# Patient Record
Sex: Female | Born: 1950 | Race: Black or African American | Hispanic: No | Smoking: Current every day smoker
Health system: Southern US, Community
[De-identification: ages and names within clinical notes are randomized; demographics above are authoritative.]

## PROBLEM LIST (undated history)

## (undated) DIAGNOSIS — R7303 Prediabetes: Secondary | ICD-10-CM

## (undated) DIAGNOSIS — M810 Age-related osteoporosis without current pathological fracture: Secondary | ICD-10-CM

## (undated) DIAGNOSIS — F17200 Nicotine dependence, unspecified, uncomplicated: Secondary | ICD-10-CM

## (undated) DIAGNOSIS — H269 Unspecified cataract: Secondary | ICD-10-CM

## (undated) DIAGNOSIS — G5600 Carpal tunnel syndrome, unspecified upper limb: Secondary | ICD-10-CM

## (undated) DIAGNOSIS — M5412 Radiculopathy, cervical region: Secondary | ICD-10-CM

## (undated) DIAGNOSIS — G510 Bell's palsy: Secondary | ICD-10-CM

## (undated) HISTORY — DX: Prediabetes: R73.03

## (undated) HISTORY — DX: Carpal tunnel syndrome, unspecified upper limb: G56.00

## (undated) HISTORY — DX: Nicotine dependence, unspecified, uncomplicated: F17.200

## (undated) HISTORY — DX: Bell's palsy: G51.0

## (undated) HISTORY — DX: Unspecified cataract: H26.9

## (undated) HISTORY — PX: CATARACT EXTRACTION: SUR2

## (undated) HISTORY — PX: ABDOMINAL HYSTERECTOMY: SHX81

## (undated) HISTORY — DX: Age-related osteoporosis without current pathological fracture: M81.0

## (undated) HISTORY — DX: Radiculopathy, cervical region: M54.12

---

## 1993-03-16 HISTORY — PX: TOTAL ABDOMINAL HYSTERECTOMY W/ BILATERAL SALPINGOOPHORECTOMY: SHX83

## 1997-10-16 ENCOUNTER — Emergency Department (HOSPITAL_COMMUNITY): Admission: EM | Admit: 1997-10-16 | Discharge: 1997-10-16 | Payer: Self-pay | Admitting: Emergency Medicine

## 1998-08-09 ENCOUNTER — Emergency Department (HOSPITAL_COMMUNITY): Admission: EM | Admit: 1998-08-09 | Discharge: 1998-08-10 | Payer: Self-pay

## 1998-08-22 ENCOUNTER — Emergency Department (HOSPITAL_COMMUNITY): Admission: EM | Admit: 1998-08-22 | Discharge: 1998-08-22 | Payer: Self-pay | Admitting: Emergency Medicine

## 1999-05-03 ENCOUNTER — Emergency Department (HOSPITAL_COMMUNITY): Admission: EM | Admit: 1999-05-03 | Discharge: 1999-05-03 | Payer: Self-pay

## 2000-04-14 ENCOUNTER — Encounter (INDEPENDENT_AMBULATORY_CARE_PROVIDER_SITE_OTHER): Payer: Self-pay | Admitting: Specialist

## 2000-04-14 ENCOUNTER — Ambulatory Visit (HOSPITAL_COMMUNITY): Admission: RE | Admit: 2000-04-14 | Discharge: 2000-04-14 | Payer: Self-pay | Admitting: Urology

## 2000-10-24 ENCOUNTER — Emergency Department (HOSPITAL_COMMUNITY): Admission: EM | Admit: 2000-10-24 | Discharge: 2000-10-24 | Payer: Self-pay | Admitting: *Deleted

## 2000-12-26 ENCOUNTER — Emergency Department (HOSPITAL_COMMUNITY): Admission: EM | Admit: 2000-12-26 | Discharge: 2000-12-26 | Payer: Self-pay | Admitting: Emergency Medicine

## 2001-03-24 ENCOUNTER — Other Ambulatory Visit: Admission: RE | Admit: 2001-03-24 | Discharge: 2001-03-24 | Payer: Self-pay | Admitting: Obstetrics and Gynecology

## 2001-09-05 ENCOUNTER — Emergency Department (HOSPITAL_COMMUNITY): Admission: EM | Admit: 2001-09-05 | Discharge: 2001-09-06 | Payer: Self-pay | Admitting: Emergency Medicine

## 2001-12-22 ENCOUNTER — Encounter: Payer: Self-pay | Admitting: Emergency Medicine

## 2001-12-22 ENCOUNTER — Emergency Department (HOSPITAL_COMMUNITY): Admission: EM | Admit: 2001-12-22 | Discharge: 2001-12-22 | Payer: Self-pay | Admitting: Emergency Medicine

## 2002-05-15 ENCOUNTER — Other Ambulatory Visit: Admission: RE | Admit: 2002-05-15 | Discharge: 2002-05-15 | Payer: Self-pay | Admitting: Obstetrics and Gynecology

## 2003-09-02 ENCOUNTER — Emergency Department (HOSPITAL_COMMUNITY): Admission: EM | Admit: 2003-09-02 | Discharge: 2003-09-03 | Payer: Self-pay | Admitting: Emergency Medicine

## 2003-12-05 ENCOUNTER — Other Ambulatory Visit: Admission: RE | Admit: 2003-12-05 | Discharge: 2003-12-05 | Payer: Self-pay | Admitting: Obstetrics and Gynecology

## 2004-02-05 ENCOUNTER — Ambulatory Visit: Payer: Self-pay | Admitting: Cardiology

## 2004-03-05 ENCOUNTER — Ambulatory Visit: Payer: Self-pay

## 2005-04-02 ENCOUNTER — Other Ambulatory Visit: Admission: RE | Admit: 2005-04-02 | Discharge: 2005-04-02 | Payer: Self-pay | Admitting: Obstetrics and Gynecology

## 2005-09-22 ENCOUNTER — Emergency Department (HOSPITAL_COMMUNITY): Admission: EM | Admit: 2005-09-22 | Discharge: 2005-09-22 | Payer: Self-pay | Admitting: Family Medicine

## 2005-10-11 ENCOUNTER — Emergency Department (HOSPITAL_COMMUNITY): Admission: EM | Admit: 2005-10-11 | Discharge: 2005-10-11 | Payer: Self-pay | Admitting: Emergency Medicine

## 2005-12-03 ENCOUNTER — Emergency Department (HOSPITAL_COMMUNITY): Admission: EM | Admit: 2005-12-03 | Discharge: 2005-12-03 | Payer: Self-pay | Admitting: Family Medicine

## 2006-06-24 ENCOUNTER — Ambulatory Visit (HOSPITAL_COMMUNITY): Admission: RE | Admit: 2006-06-24 | Discharge: 2006-06-24 | Payer: Self-pay | Admitting: *Deleted

## 2006-06-24 ENCOUNTER — Encounter (INDEPENDENT_AMBULATORY_CARE_PROVIDER_SITE_OTHER): Payer: Self-pay | Admitting: Specialist

## 2006-10-04 ENCOUNTER — Emergency Department (HOSPITAL_COMMUNITY): Admission: EM | Admit: 2006-10-04 | Discharge: 2006-10-04 | Payer: Self-pay | Admitting: Family Medicine

## 2007-03-17 HISTORY — PX: COLONOSCOPY: SHX174

## 2008-10-06 ENCOUNTER — Emergency Department (HOSPITAL_COMMUNITY): Admission: EM | Admit: 2008-10-06 | Discharge: 2008-10-06 | Payer: Self-pay | Admitting: Family Medicine

## 2008-10-08 ENCOUNTER — Emergency Department (HOSPITAL_COMMUNITY): Admission: EM | Admit: 2008-10-08 | Discharge: 2008-10-08 | Payer: Self-pay | Admitting: Emergency Medicine

## 2009-01-18 ENCOUNTER — Ambulatory Visit (HOSPITAL_COMMUNITY): Admission: RE | Admit: 2009-01-18 | Discharge: 2009-01-18 | Payer: Self-pay | Admitting: Obstetrics and Gynecology

## 2010-02-04 ENCOUNTER — Ambulatory Visit (HOSPITAL_COMMUNITY)
Admission: RE | Admit: 2010-02-04 | Discharge: 2010-02-04 | Payer: Self-pay | Source: Home / Self Care | Admitting: Obstetrics and Gynecology

## 2010-08-01 NOTE — Op Note (Signed)
NAMECAROLYNNE, SCHUCHARD NO.:  [id number]   MEDICAL RECORD NO.:  [id number]          PATIENT TYPE:  AMB   LOCATION:  ENDO                         FACILITY:  [facility]   PHYSICIAN:  Georgiana Spinner, M.D.    DATE OF BIRTH:  November 20, 1950   DATE OF PROCEDURE:  06/24/2006  DATE OF DISCHARGE:                               OPERATIVE REPORT   SURGEON:  Georgiana Spinner, M.D.   PROCEDURE:  Colonoscopy.   INDICATIONS:  Colon cancer screening.   ANESTHESIA:  Fentanyl 100 mcg, Versed 10 mg.   DESCRIPTION OF PROCEDURE:  With the patient mildly sedated in the left  lateral decubitus position, the Pentax videoscopic colonoscope was  inserted into the rectum and passed under direct vision to the cecum,  identified by the ileocecal valve and appendiceal orifice, both of which  were photographed.  From this point, the endoscope was slowly withdrawn,  taking circumferential views of colonic mucosa, stopping in the rectum,  where 2 small polyps were seen, photographed and removed using hot  biopsy forceps technique at a setting of 20/200 blended current.  The  endoscope was then straightened and withdrawn fro the retroflex view.  The patient's vital signs and pulse oximetry remained stable.  The  patient tolerated the procedure well and without apparent complications.   FINDINGS:  Two small polyps in the rectum on retroflex view, otherwise,  an unremarkable examination.   PLAN:  Await biopsy report.  Patient will call me for results and follow  up with me as needed as an outpatient.           ______________________________  Georgiana Spinner, M.D.     GMO/MEDQ  D:  06/24/2006  T:  06/24/2006  Job:  [id number]

## 2010-08-01 NOTE — Op Note (Signed)
[facility]  Patient:    NIEVE, ROJERO                       MRN: [id number] Proc. Date: 04/14/00 Adm. Date:  [id number] Attending:  Katherine Roan                           Operative Report  PREOPERATIVE DIAGNOSIS:  Papilloma, left anterior bladder wall; inflammation, right posterior base; hematuria in a smoker.  POSTOPERATIVE DIAGNOSIS:  Papilloma, left anterior bladder wall, inflammation, right posterior base, hematuria in a smoker.  OPERATION PERFORMED:  Transurethral resection of bladder tumor, left anterior wall; bladder biopsy x 2, right posterior base.  SURGEON:  Rozanna Boer., M.D.  ANESTHESIA:  General.  INDICATIONS FOR PROCEDURE:  This 60 year old black female smoker is admitted with signs of small papillary tumor in the left anterior wall and some inflammation of the right posterior base for biopsies.  She was being evaluated for hematuria and the above findings were found.  IVP showed normal upper tracts.  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  She had not had previous hematuria, does smoke one half to one pack a day.  DESCRIPTION OF PROCEDURE:  The patient was placed on the operating table in the dorsal lithotomy position after satisfactory induction of general anesthesia, was prepped and draped with Betadine in the usual sterile fashion. Her urethra was a little tight, but a #21 panendoscope was inserted and the bladder inspected.  Pictures were made of the lesion of the posterior base on the left and the left anterior wall papilloma and these lesions were then biopsied.  The papilloma came off with one biopsy and was completely excised and then two biopsies were made of the inflammation of the right posterior base.  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  The rest of the bladder looked good except for mild squamous metaplasia.  The lesions were then cauterized with a  Bugbee to effect good hemostasis.  The bladder was drained, scope removed.  Patient taken to recovery room in good condition to be later discharged as an outpatient without a catheter if she is able to void and if these are benign as expected, we will just recystoscope in three months. DD:  04/14/00 TD:  04/14/00 Job: [id number]

## 2010-10-03 ENCOUNTER — Inpatient Hospital Stay (INDEPENDENT_AMBULATORY_CARE_PROVIDER_SITE_OTHER)
Admission: RE | Admit: 2010-10-03 | Discharge: 2010-10-03 | Disposition: A | Discharge status: Home / Self Care | Payer: Self-pay | Source: Ambulatory Visit | Attending: Emergency Medicine | Admitting: Emergency Medicine

## 2010-10-03 DIAGNOSIS — K056 Periodontal disease, unspecified: Secondary | ICD-10-CM

## 2010-10-03 DIAGNOSIS — K047 Periapical abscess without sinus: Secondary | ICD-10-CM

## 2010-10-20 ENCOUNTER — Inpatient Hospital Stay (INDEPENDENT_AMBULATORY_CARE_PROVIDER_SITE_OTHER)
Admission: RE | Admit: 2010-10-20 | Discharge: 2010-10-20 | Disposition: A | Discharge status: Home / Self Care | Payer: Self-pay | Source: Ambulatory Visit | Attending: Emergency Medicine | Admitting: Emergency Medicine

## 2010-10-20 DIAGNOSIS — M779 Enthesopathy, unspecified: Secondary | ICD-10-CM

## 2011-01-27 ENCOUNTER — Other Ambulatory Visit (HOSPITAL_COMMUNITY): Payer: Self-pay | Admitting: Obstetrics and Gynecology

## 2011-01-27 DIAGNOSIS — Z1231 Encounter for screening mammogram for malignant neoplasm of breast: Secondary | ICD-10-CM

## 2011-02-26 ENCOUNTER — Ambulatory Visit (HOSPITAL_COMMUNITY)
Admission: RE | Admit: 2011-02-26 | Discharge: 2011-02-26 | Disposition: A | Discharge status: Home / Self Care | Payer: PRIVATE HEALTH INSURANCE | Source: Ambulatory Visit | Attending: Obstetrics and Gynecology | Admitting: Obstetrics and Gynecology

## 2011-02-26 DIAGNOSIS — Z1231 Encounter for screening mammogram for malignant neoplasm of breast: Secondary | ICD-10-CM

## 2011-02-27 ENCOUNTER — Other Ambulatory Visit: Payer: Self-pay | Admitting: Obstetrics and Gynecology

## 2011-02-27 DIAGNOSIS — R928 Other abnormal and inconclusive findings on diagnostic imaging of breast: Secondary | ICD-10-CM

## 2011-03-02 ENCOUNTER — Emergency Department (HOSPITAL_COMMUNITY): Payer: Self-pay

## 2011-03-02 ENCOUNTER — Other Ambulatory Visit: Payer: Self-pay

## 2011-03-02 ENCOUNTER — Emergency Department (HOSPITAL_COMMUNITY)
Admission: EM | Admit: 2011-03-02 | Discharge: 2011-03-02 | Disposition: A | Discharge status: Home / Self Care | Payer: Self-pay | Attending: Emergency Medicine | Admitting: Emergency Medicine

## 2011-03-02 ENCOUNTER — Encounter (HOSPITAL_COMMUNITY): Payer: Self-pay

## 2011-03-02 ENCOUNTER — Emergency Department (INDEPENDENT_AMBULATORY_CARE_PROVIDER_SITE_OTHER)
Admission: EM | Admit: 2011-03-02 | Discharge: 2011-03-02 | Disposition: A | Discharge status: Nursing Home (Includes ICF) | Payer: Self-pay | Source: Home / Self Care | Attending: Emergency Medicine | Admitting: Emergency Medicine

## 2011-03-02 ENCOUNTER — Encounter: Payer: Self-pay | Admitting: *Deleted

## 2011-03-02 ENCOUNTER — Encounter (HOSPITAL_COMMUNITY): Payer: Self-pay | Admitting: Emergency Medicine

## 2011-03-02 DIAGNOSIS — R079 Chest pain, unspecified: Secondary | ICD-10-CM | POA: Insufficient documentation

## 2011-03-02 DIAGNOSIS — R202 Paresthesia of skin: Secondary | ICD-10-CM

## 2011-03-02 DIAGNOSIS — H5509 Other forms of nystagmus: Secondary | ICD-10-CM | POA: Insufficient documentation

## 2011-03-02 DIAGNOSIS — Z87891 Personal history of nicotine dependence: Secondary | ICD-10-CM | POA: Insufficient documentation

## 2011-03-02 DIAGNOSIS — R42 Dizziness and giddiness: Secondary | ICD-10-CM | POA: Insufficient documentation

## 2011-03-02 DIAGNOSIS — R209 Unspecified disturbances of skin sensation: Secondary | ICD-10-CM

## 2011-03-02 LAB — CARDIAC PANEL(CRET KIN+CKTOT+MB+TROPI)
CK, MB: 3.9 ng/mL (ref 0.3–4.0)
Relative Index: 0.9 (ref 0.0–2.5)
Relative Index: 1 (ref 0.0–2.5)
Total CK: 406 U/L — ABNORMAL HIGH (ref 7–177)
Troponin I: <0.3 ng/mL (ref <0.30)
Troponin I: <0.3 ng/mL (ref <0.30)

## 2011-03-02 LAB — DIFFERENTIAL
Basophils Relative: 0 % (ref 0–1)
Lymphocytes Relative: 30 % (ref 12–46)
Lymphs Abs: 2.7 10*3/uL (ref 0.7–4.0)
Monocytes Absolute: 0.6 10*3/uL (ref 0.1–1.0)
Monocytes Relative: 7 % (ref 3–12)
Neutro Abs: 5.6 10*3/uL (ref 1.7–7.7)
Neutrophils Relative %: 61 % (ref 43–77)

## 2011-03-02 LAB — CBC
HCT: 42.5 % (ref 36.0–46.0)
Hemoglobin: 14.5 g/dL (ref 12.0–15.0)
MCHC: 34.1 g/dL (ref 30.0–36.0)
RBC: 4.96 MIL/uL (ref 3.87–5.11)

## 2011-03-02 LAB — BASIC METABOLIC PANEL
BUN: 13 mg/dL (ref 6–23)
CO2: 22 mEq/L (ref 19–32)
Chloride: 103 mEq/L (ref 96–112)
Creatinine, Ser: 0.74 mg/dL (ref 0.50–1.10)
GFR calc Af Amer: >90 mL/min (ref >90)
Glucose, Bld: 90 mg/dL (ref 70–99)
Potassium: 4 mEq/L (ref 3.5–5.1)

## 2011-03-02 MED ORDER — MECLIZINE HCL 25 MG PO TABS: 25.0000 mg | ORAL_TABLET | Freq: Three times a day (TID) | ORAL | Status: AC | PRN

## 2011-03-02 MED ORDER — MECLIZINE HCL 25 MG PO TABS
25.0000 mg | ORAL_TABLET | Freq: Three times a day (TID) | ORAL | Status: DC | PRN
Start: 2011-03-02 — End: 2011-03-02

## 2011-03-02 NOTE — ED Notes (Signed)
PT HAS RETURNED FROM MRI 

## 2011-03-02 NOTE — ED Notes (Signed)
Pt. Transferred from Urgent care Chest pain free for further care.  Pt. Reports having  Intermittent dizziness and sob, ECG was Unremarkable reported by urgent care nurse

## 2011-03-02 NOTE — ED Notes (Signed)
PT UNFRIENDLY TO STAFF. STATES WE ARE NOT DOING A GOOD JOB BECAUSE SHE HAS BEEN HERE SINCE 11 AM AND HAS NOT GOTTEN HER STUDIES COMPLETED AND SHE IS HUNGRY AND THIRSTY. PT DOES NOT ALLOW STAFF TO FINISH A SENTENCE BEFORE JUMPING IN WITH NEGATIVE COMMENTS.

## 2011-03-02 NOTE — ED Provider Notes (Signed)
History     CSN: [id number] Arrival date & time: 03/02/2011  9:12 AM   First MD Initiated Contact with Patient 03/02/11 [phone]      Chief Complaint  Patient presents with  . Dizziness  . Numbness  . Chest Pain    (Consider location/radiation/quality/duration/timing/severity/associated sxs/prior treatment) HPI Comments: For about 3 days , been feeling dizzy, like spells they come and go, have felt some chest pains" (points to substernal region), with shortness of breath, and my R arm goes numb" last chest pain was last night", Im just lightheaded now",  Patient is a 60 y.o. female presenting with chest pain. The history is provided by the patient.  Chest Pain The chest pain began 3 - 5 days ago. Chest pain occurs intermittently. The chest pain is worsening. The quality of the pain is described as brief and aching. The pain does not radiate. Primary symptoms include shortness of breath and dizziness. Pertinent negatives for primary symptoms include no fatigue, no syncope, no palpitations, no abdominal pain, no nausea, no vomiting and no altered mental status.  Dizziness does not occur with nausea, vomiting, weakness or diaphoresis.  Associated symptoms include numbness.  Pertinent negatives for associated symptoms include no diaphoresis, no lower extremity edema, no near-syncope, no orthopnea, no paroxysmal nocturnal dyspnea and no weakness. She tried nothing for the symptoms.     History reviewed. No pertinent past medical history.  Past Surgical History  Procedure Date  . Abdominal hysterectomy     Family History  Problem Relation Age of Onset  . Diabetes Mother     History  Substance Use Topics  . Smoking status: Unknown If Ever Smoked  . Smokeless tobacco: Not on file  . Alcohol Use: Yes     Social Use    OB History    Grav Para Term Preterm Abortions TAB SAB Ect Mult Living                  Review of Systems  Constitutional: Negative for diaphoresis and  fatigue.  Respiratory: Positive for shortness of breath.   Cardiovascular: Positive for chest pain. Negative for palpitations, orthopnea, syncope and near-syncope.  Gastrointestinal: Negative for nausea, vomiting and abdominal pain.  Neurological: Positive for dizziness and numbness. Negative for weakness.  Psychiatric/Behavioral: Negative for altered mental status.    Allergies  Review of patient's allergies indicates no known allergies.  Home Medications   Current Outpatient Rx  Name Route Sig Dispense Refill  . NAPROXEN SODIUM 220 MG PO TABS Oral Take 220 mg by mouth 2 (two) times daily as needed. For pain       BP 132/85  Pulse 70  Temp(Src) 98 F (36.7 C) (Oral)  Resp 18  SpO2 97%  Physical Exam  Nursing note and vitals reviewed. Constitutional: She is oriented to person, place, and time. She appears well-developed and well-nourished. No distress.  Eyes: Pupils are equal, round, and reactive to light.  Neck: Normal range of motion. No JVD present.  Cardiovascular: Normal rate, regular rhythm and normal heart sounds.   Pulmonary/Chest: Effort normal and breath sounds normal. No respiratory distress. She has no decreased breath sounds.  Neurological: She is alert and oriented to person, place, and time. She has normal strength. No cranial nerve deficit or sensory deficit. She exhibits normal muscle tone. Coordination normal.  Skin: Skin is warm. She is not diaphoretic.    ED Course  Procedures (including critical care time)  Labs Reviewed - No data to display  No results found.   1. Dizziness   2. Chest pain   3. Paresthesias       MDM  Multi-symptomatic-comfortable and stable, including recurrent CP and paresthesias.      Jimmie Molly, MD 03/02/11 [phone]

## 2011-03-02 NOTE — ED Notes (Signed)
Pt c/o "dizzy spells" x approx 3 days. Pt states, "When they come on, my right arm goes numb, my ringers tingle, and I get some pain in my chest too" - severity increases w/ fast, quick movement. Pt denies s/s at this time; denies visual disturbances when these "spells" occur.

## 2011-03-02 NOTE — ED Notes (Signed)
MEAL ORDERED FOR PT 

## 2011-03-02 NOTE — ED Provider Notes (Signed)
History     CSN: [id number] Arrival date & time: 03/02/2011 10:37 AM   First MD Initiated Contact with Patient 03/02/11 1201      Chief Complaint  Patient presents with  . Dizziness    HPI  60yoF is a healthy presents with multiple complaints. The patient is complaining of lightheadedness as her chief complaint. She describes some vertigo component to it. It is intermittent and worse when she is walking around when she moves her head to the left. No ear pain/fullness/tinnitus. Has been present for 3 days. She denies pain in her ears. She denies fever, chills. She states that for the past one week she's experienced right arm heaviness/numbness and paresthesias to all fingers. This has been constant. She denies any pain in her neck or back. Over the past one week she's also experienced intermittent chest "pinching" retrosternal without radiation. She's also had 2 episodes of shortness of breath with exertion. She denies headache. She denies history of similar. She states that approximately 9 months ago she had a normal stress test. She is a former smoker otherwise no risk factors for coronary artery disease. Denies h/o VTE in self or family. No recent hosp/surg/immob. No h/o cancer. Denies exogenous hormone use, no leg pain or swelling  The patient was seen this morning by urgent care referred here. See their note for full details.  History reviewed. No pertinent past medical history.  Past Surgical History  Procedure Date  . Abdominal hysterectomy     Family History  Problem Relation Age of Onset  . Diabetes Mother     History  Substance Use Topics  . Smoking status: Unknown If Ever Smoked  . Smokeless tobacco: Not on file  . Alcohol Use: Yes     Social Use    OB History    Grav Para Term Preterm Abortions TAB SAB Ect Mult Living                  Review of Systems  All other systems reviewed and are negative.   except as noted HPI   Allergies  Review of patient's  allergies indicates no known allergies.  Home Medications   Current Outpatient Rx  Name Route Sig Dispense Refill  . NAPROXEN SODIUM 220 MG PO TABS Oral Take 220 mg by mouth 2 (two) times daily as needed. For pain       BP 155/77  Pulse 64  Temp(Src) 98.8 F (37.1 C) (Oral)  Resp 18  Ht 4\' 11"  (1.499 m)  Wt 160 lb (72.576 kg)  BMI 32.32 kg/m2  SpO2 97%  Physical Exam  Nursing note and vitals reviewed. Constitutional: She is oriented to person, place, and time. She appears well-developed.  HENT:  Head: Atraumatic.  Mouth/Throat: Oropharynx is clear and moist.       TM clear b/l  Eyes: Conjunctivae and EOM are normal. Pupils are equal, round, and reactive to light.       Horizontal nystagmus with EOM  Neck: Normal range of motion. Neck supple.  Cardiovascular: Normal rate, regular rhythm, normal heart sounds and intact distal pulses.   Pulmonary/Chest: Effort normal and breath sounds normal. No respiratory distress. She has no wheezes. She has no rales.  Abdominal: Soft. She exhibits no distension. There is no tenderness. There is no rebound and no guarding.  Musculoskeletal: Normal range of motion.  Neurological: She is alert and oriented to person, place, and time. No cranial nerve deficit. She exhibits normal muscle tone. Coordination  normal.       Strength 5/5 all extremities No pronator drift No facial droop  Dix Hallpike neg  Skin: Skin is warm and dry. No rash noted.  Psychiatric: She has a normal mood and affect.    Date: 03/02/2011  Rate: 60  Rhythm: normal sinus rhythm  QRS Axis: normal  Intervals: normal  ST/T Wave abnormalities: nonspecific T wave changes  Conduction Disutrbances:none  Narrative Interpretation:   Old EKG Reviewed: changes noted new t wave inversions aVF   Date: 03/02/2011  Rate: 64  Rhythm: normal sinus rhythm  QRS Axis: normal  Intervals: normal  ST/T Wave abnormalities: nonspecific T wave changes  Conduction Disutrbances:none   Narrative Interpretation:   Old EKG Reviewed: unchanged   ED Course  Procedures (including critical care time)   Labs Reviewed  CBC  DIFFERENTIAL  BASIC METABOLIC PANEL  CARDIAC PANEL(CRET KIN+CKTOT+MB+TROPI)   No results found.   No diagnosis found.    MDM  Presents with multiple complaints. Her chief complaint here is dizziness her neurological exam is intact although she does have some nystagmus in the horizontal direction. Her Gilberto Better is negative and I cannot reproduce her dizziness/vertigo here with movement. In light of her recent right arm numbness and paresthesias order CT head and follow up MRI is negative. I do not suspect a cervical etiology. She also complains of atypical chest pain and shortness of breath over the past one week. She is a former smoker but had TIMI 0. CT unremarkable. Chest x-ray, labs pending. Discussed with Arvella Merles, PA. Who will monitor pt in CDU and f/u pending imaging studies and result of lab work.        Forbes Cellar, MD 03/02/11 2322

## 2011-03-02 NOTE — ED Provider Notes (Signed)
MRI results reviewed and discussed with patient and with Dr. Hyman Hopes.  No indication of stroke.  Patient will be discharged home with meclizine for dizziness.  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  Will treat conservatively with NSAID's at present.  Patient does not have primary care--resource information will be provided.  Jimmye Norman, NP 03/02/11 2002

## 2011-03-02 NOTE — ED Provider Notes (Signed)
Medical screening examination/treatment/procedure(s) were conducted as a shared visit with non-physician practitioner(s) and myself.  I personally evaluated the patient during the encounter  See my full note for details  Forbes Cellar, MD 03/02/11 2054

## 2011-03-02 NOTE — ED Notes (Signed)
MRI CALLED TO CHECK ON DELAY IN GETTING STUDY PERFORMED. THEY ADVISE THAT IT WILL BE APPROX 45 MINUTES BEFORE THEY WILL HAVE A SCANNER FREE

## 2011-03-10 DIAGNOSIS — G510 Bell's palsy: Secondary | ICD-10-CM

## 2011-03-10 HISTORY — DX: Bell's palsy: G51.0

## 2011-04-07 ENCOUNTER — Ambulatory Visit (INDEPENDENT_AMBULATORY_CARE_PROVIDER_SITE_OTHER): Payer: Self-pay | Admitting: *Deleted

## 2011-04-07 ENCOUNTER — Other Ambulatory Visit: Payer: Self-pay | Admitting: Obstetrics and Gynecology

## 2011-04-07 VITALS — BP 121/75 | HR 78 | Temp 97.6°F | Ht 60.25 in | Wt 173.3 lb

## 2011-04-07 DIAGNOSIS — N6320 Unspecified lump in the left breast, unspecified quadrant: Secondary | ICD-10-CM

## 2011-04-07 DIAGNOSIS — N63 Unspecified lump in unspecified breast: Secondary | ICD-10-CM

## 2011-04-07 DIAGNOSIS — Z1239 Encounter for other screening for malignant neoplasm of breast: Secondary | ICD-10-CM

## 2011-04-07 NOTE — Progress Notes (Signed)
No complaints today. Patient referred to [facility] due to needing additional imaging of left breast.  Pap Smear:    Pap smear not performed today. Patients last Pap smear was 11/12/08 and was normal. Per patient she has no history of abnormal Pap smears. Patient has a history of a complete hysterectomy for fibroids. Per [facility] guidelines patient does not need any further Pap smears. Pap smear result above is in EPIC.  Physical exam: Breasts Breasts symmetrical. Skin dry bilateral breasts. No nipple retraction bilateral breasts. No nipple discharge bilateral breasts. No lymphadenopathy. No lumps palpated right breast. Palpated lump in the left breast around 2 o'clock 13 cm from the nipple measuring 2 cm in width and 1.5 cm in length. No complaints of pain or tenderness on palpation. Patient referred to the [facility] for left  Diagnostic Mammogram and possible left breast ultrasound Tuesday, April 14, 2011 at 0900.         Pelvic/Bimanual No Pap smear completed today since last Pap smear was 11/12/08 and normal. Pap smear not indicated per [facility] guidelines. See above note for details.

## 2011-04-07 NOTE — Patient Instructions (Signed)
Taught patient how to perform BSE and gave educational materials to take home. Patient did not need a Pap smear today due to last Pap smear was 11/12/2008 and normal. Patient has a history of hysterectomy for fibroids. Told patient she will not need further Pap smears due to history of hysterectomy for benign reasons and no history of abnormal Pap smears per [facility] guidelines. Patient is scheduled for a left breast diagnostic mammogram next Tuesday, April 14, 2011 at 0900. Patient aware of appointment and will be there. Let patient know will follow up with her within the next few weeks with results by letter or phone. Patient verbalized understanding.

## 2011-04-14 ENCOUNTER — Ambulatory Visit
Admission: RE | Admit: 2011-04-14 | Discharge: 2011-04-14 | Disposition: A | Discharge status: Home / Self Care | Payer: No Typology Code available for payment source | Source: Ambulatory Visit | Attending: Obstetrics and Gynecology | Admitting: Obstetrics and Gynecology

## 2011-04-14 DIAGNOSIS — N6320 Unspecified lump in the left breast, unspecified quadrant: Secondary | ICD-10-CM

## 2011-04-24 ENCOUNTER — Telehealth: Payer: Self-pay | Admitting: *Deleted

## 2011-04-24 NOTE — Telephone Encounter (Signed)
Patient called me back. Patient did receive results of diagnostic mammogram from the [facility]. Let patient know she is due for a screening mammogram in December since that will be one year. Told patient to call us to set up an appointment if she is still eligible for [facility]. Patient verbalized understanding.

## 2011-04-24 NOTE — Telephone Encounter (Signed)
Attempted to call patient to follow up on results from [facility]. No one answered phone. Left voicemail for patient to call me back.

## 2011-06-23 ENCOUNTER — Encounter (HOSPITAL_COMMUNITY): Payer: Self-pay

## 2011-06-23 ENCOUNTER — Emergency Department (HOSPITAL_COMMUNITY)
Admission: EM | Admit: 2011-06-23 | Discharge: 2011-06-23 | Disposition: A | Discharge status: Home / Self Care | Payer: Self-pay | Source: Home / Self Care | Attending: Emergency Medicine | Admitting: Emergency Medicine

## 2011-06-23 DIAGNOSIS — M778 Other enthesopathies, not elsewhere classified: Secondary | ICD-10-CM

## 2011-06-23 DIAGNOSIS — M65839 Other synovitis and tenosynovitis, unspecified forearm: Secondary | ICD-10-CM

## 2011-06-23 MED ORDER — MELOXICAM 7.5 MG PO TABS: 7.5000 mg | ORAL_TABLET | Freq: Every day | ORAL | Status: DC

## 2011-06-23 NOTE — ED Notes (Signed)
C/o rt hand pain that radiates up entire rt arm for 2 weeks.  States pain is worse at night.  States it feels like what was diagnosed as carpal tunnel before.

## 2011-06-23 NOTE — ED Provider Notes (Signed)
History     CSN: [id number]  Arrival date & time 06/23/11  7829   First MD Initiated Contact with Patient 06/23/11 0848      Chief Complaint  Patient presents with  . Hand Pain    (Consider location/radiation/quality/duration/timing/severity/associated sxs/prior treatment) HPI Comments: Patient describes she's been having mostly right hand pain (points to palmar and dorsal aspect of right wrist) she ascribes that the pain starts area shoots up towards her arm been going on and off for about 2 weeks feels a pain is much worse at night. Have experienced some nonspecific numbness and tingling in some fingers (unable to pinpoint which particular pattern). Denies any muscular weakness or any recent injuries or traumas. 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 female presenting with hand pain.  Hand Pain This is a new problem. The current episode started more than 1 week ago. The problem occurs constantly. The problem has not changed since onset.The symptoms are aggravated by bending and twisting. The symptoms are relieved by nothing. She has tried nothing for the symptoms.    Past Medical History  Diagnosis Date  . Bell's palsy     Past Surgical History  Procedure Date  . Abdominal hysterectomy     Family History  Problem Relation Age of Onset  . Diabetes Mother   . Heart disease Father     History  Substance Use Topics  . Smoking status: Never Smoker   . Smokeless tobacco: Never Used  . Alcohol Use: No     Social Use    OB History    Grav Para Term Preterm Abortions TAB SAB Ect Mult Living   0               Review of Systems  Constitutional: Negative for fever, chills, activity change, appetite change and fatigue.  Musculoskeletal: Negative for joint swelling.  Skin: Negative for color change, pallor, rash and wound.  Neurological: Positive for numbness. Negative for dizziness,  facial asymmetry and weakness.    Allergies  Review of patient's allergies indicates no known allergies.  Home Medications   Current Outpatient Rx  Name Route Sig Dispense Refill  . MELOXICAM 7.5 MG PO TABS Oral Take 1 tablet (7.5 mg total) by mouth daily. 14 tablet 0  . NAPROXEN SODIUM 220 MG PO TABS Oral Take 220 mg by mouth 2 (two) times daily as needed. For pain     . PENICILLIN V POTASSIUM 500 MG PO TABS Oral Take 500 mg by mouth 3 (three) times daily.      BP 127/75  Pulse 78  Temp(Src) 98.5 F (36.9 C) (Oral)  Resp 12  SpO2 99%  Physical Exam  Nursing note and vitals reviewed. Constitutional: She appears well-developed and well-nourished.  HENT:  Head: Normocephalic.  Musculoskeletal: She exhibits tenderness.       Right wrist: She exhibits decreased range of motion, tenderness and bony tenderness. She exhibits no swelling, no crepitus and no deformity.       Arms: Neurological: She is alert. No cranial nerve deficit.  Skin: No rash noted. No erythema.    ED Course  Procedures (including critical care time)  Labs Reviewed - No data to display No results found.   1. Tendinitis of right wrist       MDM  Patient presents with generalized dorsal and palmar surface wrist pain nonspecific. No recent injuries  or traumas. Reports some paresthesias but 2. discrimination was conserved and peripheral capillary refill was under 3 seconds. Have provided the patient with a wrist splint with instructions to use it for 7-14 days as needed. With an NSAID cycle for 2 weeks. Instructed to followup with an orthopedic doctor pain was to persist or other diagnostic and treatment modalities        Jimmie Molly, MD 06/23/11 1211

## 2011-06-23 NOTE — Discharge Instructions (Signed)
We discussed that if pain persisted beyond 2 weeks should followup with an orthopedic doctor for further evaluation and other treatment modalities I believe you might have to different conditions this is the most likely or predominant type of tendinitis your current exam is suggesting. Use prescribed medicine for 2 weeks and use provided wrist splint as much as possible as you're performing normal active  Extensor Pollicis Longus Tendinitis Extensor pollicis longus (EPL) tendonitis is a condition in which the EPL tendon lining (sheath) becomes inflamed. This causes pain on the thumb side (radial side) of the back of the wrist. The EPL tendon attaches the EPL muscle to the bone. The EPL muscle straightens the thumb. The tendon lining secretes a lubricant that allows the EPL to glide smoothly. When the lining becomes inflamed, the tendon can not glide smoothly, which causes pain. There are three grades of EPL tendonitis. A grade 1 strain is a mild strain, where the tendon experiences a slight pull, but no obvious tearing (microscopic tearing). There is no loss of strength, and the tendon is the correct length. Grade 2 strains are a moderate strain. There is tearing of tendon fibers within the tendon or where the tendon meets the bone or muscle. Tearing of the tendon causes the length of the whole muscle-tendon-bone unit to increase. A grade 2 injury usually results in decreased strength. A grade 3 strain is a complete rupture of the tendon.  CAUSES   EPL tendinitis is often an overuse injury and caused by repeated motions of the hand and wrist, due to friction of the tendon within the lining.   Sudden increase in activity or change in activity.  SYMPTOMS   Vague, diffuse pain and tenderness over the thumb side of the back of the wrist.   Pain that gets worse when straightening the thumb.   Locking, catching, or triggering of the thumb joint.   Limited motion of the thumb.   Little or no swelling,  warmth, or redness of the back of the wrist.   Crackling sound (crepitation) when the tendon or wrist is moved or touched.  RISK INCREASES WITH:  Sports that involve repeated hand and wrist motions (tennis, golf, bowling).   Previous wrist injury.   Heavy labor.   Poor wrist strength and flexibility.   Failure to warm up properly before activity.  PREVENTION   Warm up and stretch properly before activity.   Allow time for adequate rest and recovery between practices and competition.   Maintain physical fitness:   Forearm, wrist, and hand flexibility.   Muscle strength and endurance.   Learn and use proper exercise technique.  PROGNOSIS  This condition is often curable within 6 weeks, if treated properly with non-surgical treatment and resting the affected area. Surgery may be advised. RELATED COMPLICATIONS   Longer healing time, if not properly treated, or if not given adequate time to heal.   Chronic inflammation of the EPL tendon, causing pain.   Recurring of symptoms, especially if activity is resumed too soon.   Risks of surgery: infection, bleeding, injury to nerves (numbness of the thumb), continued pain, incomplete release of the tendon lining, recurring symptoms, cutting of the tendon, thumb weakness, thumb stiffness, and inability to straighten the thumb.  TREATMENT  Treatment first involves ice and medicine to reduce pain and inflammation. Stretching and strengthening exercises, along with activity modification, may be advised to reduce painful symptoms. For certain cases, a brace, splint, or taping may be advised, to reduce wrist  movement during activity. Your caregiver may recommend a corticosteroid injection to reduce inflammation. If non-surgical treatment is unsuccessful, surgery may be advised to release the inflamed tendon. If the tendon is torn, it may require repair or replacement (reconstruction). MEDICATION   If pain medicine is needed, nonsteroidal  anti-inflammatory medicines, (aspirin and ibuprofen), or other minor pain relievers (acetaminophen), are often advised.   Do not take pain medicine for 7 days before surgery.   Prescription pain relievers are usually only prescribed after surgery. Use only as directed and only as much as you need.   Cortisone injections reduce inflammation. However, these are used only in extreme cases, because there is a limit to the number of times they may be given. These injections may weaken muscle and tendon tissue. Anesthetics also temporarily relieve pain.  COLD THERAPY  Cold treatment (icing) relieves pain and reduces inflammation. Cold treatment should be applied for 10 to 15 minutes every 2 to 3 hours, and immediately after activity that aggravates your symptoms. Use ice packs or an ice massage. SEEK MEDICAL CARE IF:   Symptoms get worse or do not improve in 2 to 4 weeks, despite treatment.   You experience pain, numbness, or coldness in the hand.   Blue, gray, or dark color appears in the fingernails.   Any of the following occur after surgery: increased pain, swelling, redness, drainage of fluids, bleeding in the affected area, or signs of infection.   New, unexplained symptoms develop. (Drugs used in treatment may produce side effects.)  Document Released: 03/02/2005 Document Revised: 02/19/2011 Document Reviewed: 06/14/2008 [facility] Patient Information 2012 [place], [place].

## 2011-10-19 ENCOUNTER — Ambulatory Visit (INDEPENDENT_AMBULATORY_CARE_PROVIDER_SITE_OTHER): Payer: PRIVATE HEALTH INSURANCE | Admitting: Family Medicine

## 2011-10-19 ENCOUNTER — Encounter: Payer: Self-pay | Admitting: Family Medicine

## 2011-10-19 VITALS — BP 102/62 | HR 80 | Temp 98.5°F | Ht 60.0 in | Wt 159.0 lb

## 2011-10-19 DIAGNOSIS — R5381 Other malaise: Secondary | ICD-10-CM

## 2011-10-19 DIAGNOSIS — Z23 Encounter for immunization: Secondary | ICD-10-CM

## 2011-10-19 DIAGNOSIS — R5383 Other fatigue: Secondary | ICD-10-CM

## 2011-10-19 DIAGNOSIS — Z833 Family history of diabetes mellitus: Secondary | ICD-10-CM

## 2011-10-19 DIAGNOSIS — G510 Bell's palsy: Secondary | ICD-10-CM

## 2011-10-19 DIAGNOSIS — F17200 Nicotine dependence, unspecified, uncomplicated: Secondary | ICD-10-CM

## 2011-10-19 LAB — CBC WITH DIFFERENTIAL/PLATELET
Eosinophils Relative: 2 % (ref 0–5)
HCT: 43.4 % (ref 36.0–46.0)
Hemoglobin: 15.1 g/dL — ABNORMAL HIGH (ref 12.0–15.0)
Lymphocytes Relative: 24 % (ref 12–46)
MCHC: 34.8 g/dL (ref 30.0–36.0)
MCV: 83.1 fL (ref 78.0–100.0)
Monocytes Absolute: 0.8 10*3/uL (ref 0.1–1.0)
Monocytes Relative: 7 % (ref 3–12)
Neutro Abs: 7.7 10*3/uL (ref 1.7–7.7)
WBC: 11.6 10*3/uL — ABNORMAL HIGH (ref 4.0–10.5)

## 2011-10-19 NOTE — Patient Instructions (Addendum)
Please Quit SMOKING!  [email or web address]-[email or web address] and [email or web address] offer free counseling, and [facility] has free smoking cessation classes.  We are doing labs today to look for reasons for your fatigue and symptoms. Try and get at least 30 minutes of exercise every day. Try and pay attention to which things that you eat may contribute to your symptoms, to see what can be avoided.  In general stay away from a lot of sugar and carbs, and try and eat more protein, to help maintain steady blood sugars.  Please see an eye doctor, due to ongoing right-sided weakness from the Bell's Palsy.

## 2011-10-19 NOTE — Progress Notes (Signed)
Chief Complaint  Patient presents with  . Fatigue    weakness and tired feeling x 1 month. Also states that she sleeps " real restless" all the time. A few hours after she eats she gets real hungry and nauseous.   HPI: For about a month her legs feel very tired, generalized fatigue.  Denies headaches or dizziness.  Denies congestion or allergies.  Some days are fine--today is a good day.  Denies shortness of breath Feels draggy, worse after eating.  Sometimes about 1/2 hour after eating she feels nauseated and also sometimes hungry again at the same time.  Denies reflux, heartburn.  Bowels are normal.  She reports having had a colonoscopy at age 68-59, reportedly normal.  Denies weight changes--other than gained some after she quit smoking--hasn't lost the weight back after restarting smoking.  Walks at work, but no regular aerobic exercise.  She reports normal lipid check about 1.5 years ago. Doesn't recall last tetanus-->10 years ago. Checked for diabetes in May when she went for smoking cessation study at [facility]--was told she was borderline.  She falls asleep okay, but tosses and turns a lot.  Denies snoring.  Wakes up frequently during the night, but able to fall back asleep.  This is intermittent.  Woke up with "twisted" face on Christmas, went to urgent care the next day.  Still has weakness L face.  She had felt weak and dizzy prior to this, and had scan which was reportedly normal.  She saw Dr. Terrace Arabia at [facility] who told her it could take 6-12 months to resolve.  Has some complaints of her right eye feeling tight.  Past Medical History  Diagnosis Date  . Bell's palsy 03/10/2011    diagnosed in [place]. ongoing R sided facial weakness    Past Surgical History  Procedure Date  . Total abdominal hysterectomy w/ bilateral salpingoophorectomy 1995    History   Social History  . Marital Status: Married    Spouse Name: N/A    Number of Children: N/A  . Years of Education:  N/A   Occupational History  . parts Nature conservation officer   Social History Main Topics  . Smoking status: Current Everyday Smoker -- 0.5 packs/day for 41 years    Types: Cigarettes  . Smokeless tobacco: Never Used   Comment: quit x 1.5 years, restarted 06/2011  . Alcohol Use: Yes     2 drinks per month, maybe.  . Drug Use: No  . Sexually Active: Not on file   Other Topics Concern  . Not on file   Social History Narrative   Widowed and re-married. No children    Family History  Problem Relation Age of Onset  . Diabetes Mother   . Heart disease Father   . Arthritis Sister     No current outpatient prescriptions on file.  No Known Allergies  ROS: denies fevers, URI symptoms, chest pain, shortness of breath, GI complaints--no bowel changes or heartburn.  See HPI.  Denies urinary complaints. See HPI  PHYSICAL EXAM: BP 102/62  Pulse 80  Temp 98.5 F (36.9 C) (Oral)  Ht 5' (1.524 m)  Wt 159 lb (72.122 kg)  BMI 31.05 kg/m2 Well developed, pleasant female in no distress HEENT:  PERRL, conjunctiva--mild irritation laterally on R, slightly thickened.  Eye fully closes with blinking.  R facial weakness/paralysis--able to close eye, but not able to raise eyebrow or squeeze tightly.  No movement of mouth with smiling.  OP  clear.  TM's and EAC's normal Neck: no lymphadenopathy or mass.  Slight radiation of murmur to carotids.  No thyromegaly Heart: regular rate and rhythm with 3/6 SEM loudest at RUSB Lungs: clear bilaterally Back: no CVA or spinal tenderness Abdomen: soft, nontender, no mass Extremities: no edema, 2+ pulse.  Thickened toenails (polished) Psych: normal mood, affect, hygiene and grooming Neuro: see HEENT for abnormal cranial nerve exam.  Strength otherwise normal, normal sensation and DTR's. Normal gait  ASSESSMENT/PLAN: 1. Other malaise and fatigue  Comprehensive metabolic panel, CBC with Differential, Vitamin D 25 hydroxy, TSH  2. Family history of  diabetes mellitus  Hemoglobin A1c  3. Need for Tdap vaccination  Tdap vaccine greater than or equal to 7yo IM  4. Bell's palsy     residual R sided weakness   F/u ophtho--due to Bell's palsy with R sided weakness, and for routine exam  TdaP today  CBC, c-met, TSH, Vitamin D-OH, A1c to evaluate fatigue. Discussed healthy diet, regular exercise.  Will pay attention more to what she has eaten when she has symptoms after meals.  Encouraged to quit smoking

## 2011-10-20 LAB — COMPREHENSIVE METABOLIC PANEL
ALT: 19 U/L (ref 0–35)
AST: 17 U/L (ref 0–37)
BUN: 15 mg/dL (ref 6–23)
Calcium: 9.9 mg/dL (ref 8.4–10.5)
Chloride: 106 mEq/L (ref 96–112)
Creat: 0.85 mg/dL (ref 0.50–1.10)
Total Bilirubin: 0.3 mg/dL (ref 0.3–1.2)

## 2011-10-21 ENCOUNTER — Other Ambulatory Visit: Payer: Self-pay | Admitting: *Deleted

## 2011-10-21 DIAGNOSIS — E559 Vitamin D deficiency, unspecified: Secondary | ICD-10-CM

## 2011-10-21 MED ORDER — ERGOCALCIFEROL 1.25 MG (50000 UT) PO CAPS: 50000.0000 [IU] | ORAL_CAPSULE | ORAL | Status: DC

## 2012-03-25 ENCOUNTER — Other Ambulatory Visit: Payer: Self-pay | Admitting: Obstetrics and Gynecology

## 2012-04-01 ENCOUNTER — Other Ambulatory Visit: Payer: Self-pay | Admitting: Obstetrics and Gynecology

## 2012-04-01 DIAGNOSIS — Z1231 Encounter for screening mammogram for malignant neoplasm of breast: Secondary | ICD-10-CM

## 2012-04-06 ENCOUNTER — Encounter (HOSPITAL_COMMUNITY): Payer: Self-pay | Admitting: *Deleted

## 2012-04-07 ENCOUNTER — Encounter (HOSPITAL_COMMUNITY): Payer: Self-pay | Admitting: Emergency Medicine

## 2012-04-07 ENCOUNTER — Emergency Department (INDEPENDENT_AMBULATORY_CARE_PROVIDER_SITE_OTHER)
Admission: EM | Admit: 2012-04-07 | Discharge: 2012-04-07 | Disposition: A | Discharge status: Home / Self Care | Payer: Self-pay | Source: Home / Self Care | Attending: Family Medicine | Admitting: Family Medicine

## 2012-04-07 DIAGNOSIS — N390 Urinary tract infection, site not specified: Secondary | ICD-10-CM

## 2012-04-07 LAB — POCT URINALYSIS DIP (DEVICE)
Ketones, ur: NEGATIVE mg/dL
Protein, ur: NEGATIVE mg/dL
Specific Gravity, Urine: >=1.03 (ref 1.005–1.030)
pH: 5 (ref 5.0–8.0)

## 2012-04-07 MED ORDER — CEPHALEXIN 500 MG PO CAPS: 500.0000 mg | ORAL_CAPSULE | Freq: Two times a day (BID) | ORAL | Status: DC

## 2012-04-07 MED ORDER — TRAMADOL HCL 50 MG PO TABS: 50.0000 mg | ORAL_TABLET | Freq: Four times a day (QID) | ORAL | Status: DC | PRN

## 2012-04-07 NOTE — ED Provider Notes (Signed)
History     CSN: [id number]  Arrival date & time 04/07/12  1523   First MD Initiated Contact with Patient 04/07/12 1536      Chief Complaint  Patient presents with  . Hematuria    (Consider location/radiation/quality/duration/timing/severity/associated sxs/prior treatment) HPI Comments: 62 year old smoker female status post hysterectomy here complaining of urinary frequency, low abdominal pressure discomfort with urination, abdominal bloating and blood in her urine for about 3 days. Patient also complaining of left flank discomfort. Denies fever or chills. Denies nausea, vomiting or diarrhea. Denies vaginal discharge. Patient is status post hysterectomy secondary to fibroids as per her reports. Denies prior episodes of hematuria. No history of kidney stones.   Past Medical History  Diagnosis Date  . Bell's palsy 03/10/2011    diagnosed in [place]. ongoing R sided facial weakness    Past Surgical History  Procedure Date  . Total abdominal hysterectomy w/ bilateral salpingoophorectomy 1995    Family History  Problem Relation Age of Onset  . Diabetes Mother   . Heart disease Father   . Arthritis Sister     History  Substance Use Topics  . Smoking status: Current Every Day Smoker -- 0.5 packs/day for 41 years    Types: Cigarettes  . Smokeless tobacco: Never Used     Comment: quit x 1.5 years, restarted 06/2011  . Alcohol Use: Yes     Comment: 2 drinks per month, maybe.    OB History    Grav Para Term Preterm Abortions TAB SAB Ect Mult Living   0               Review of Systems  Constitutional: Negative for fever, chills, appetite change and fatigue.  Gastrointestinal: Positive for diarrhea. Negative for nausea, vomiting and constipation.  Genitourinary: Positive for dysuria, frequency and hematuria. Negative for vaginal discharge and pelvic pain.  Neurological: Negative for dizziness and headaches.    Allergies  Review of patient's allergies indicates no  known allergies.  Home Medications   Current Outpatient Rx  Name  Route  Sig  Dispense  Refill  . CEPHALEXIN 500 MG PO CAPS   Oral   Take 1 capsule (500 mg total) by mouth 2 (two) times daily.   14 capsule   0   . ERGOCALCIFEROL 50000 UNITS PO CAPS   Oral   Take 1 capsule (50,000 Units total) by mouth once a week.   4 capsule   2   . TRAMADOL HCL 50 MG PO TABS   Oral   Take 1 tablet (50 mg total) by mouth every 6 (six) hours as needed for pain.   15 tablet   0     BP 138/68  Pulse 66  Temp 98.3 F (36.8 C) (Oral)  Resp 20  SpO2 95%  Physical Exam  Nursing note and vitals reviewed. Constitutional: She is oriented to person, place, and time. She appears well-developed and well-nourished. No distress.  HENT:  Head: Atraumatic.  Mouth/Throat: No oropharyngeal exudate.  Eyes: Conjunctivae normal are normal. No scleral icterus.  Neck: Neck supple.  Cardiovascular: Normal heart sounds.   Pulmonary/Chest: Breath sounds normal.  Abdominal: Soft. She exhibits no distension and no mass. There is no rebound and no guarding.       No costovertebral tenderness Left flank and suprapubic tenderness to the palpation  Lymphadenopathy:    She has no cervical adenopathy.  Neurological: She is alert and oriented to person, place, and time.  Skin: No rash noted.  She is not diaphoretic.    ED Course  Procedures (including critical care time)  Labs Reviewed  POCT URINALYSIS DIP (DEVICE) - Abnormal; Notable for the following:    Hgb urine dipstick MODERATE (*)     Nitrite POSITIVE (*)     All other components within normal limits   No results found.   1. Urinary tract infection       MDM  Impress urinary tract infection given symptoms and finding in patients urine. There's also a possibility for kidney stones. Prescribed Keflex and tramadol. Supportive care and red flags that should prompt her return to medical attention discussed with patient and provided in  writing. Specifically patient was asked to go to the emergency department if new onset of fever or worsening pain or not keeping fluids down despite following treatment and also was asked to followup with urology or primary care provider if persistent blood in her urine despite completing treatment she was made aware of higher risk for developing bladder cancer given history of smoking.        Sharin Grave, MD 04/08/12 1027

## 2012-04-07 NOTE — ED Notes (Signed)
Reports minimal vaginal discharge

## 2012-04-07 NOTE — ED Notes (Signed)
Reports blood in urine yesterday am, patient has seen this blood again since then.  Reports this is usually when she has held urine for a long time.  Patient reports abdominal bloating, pain at lower abdomen with urination.  Left flank pain r=present

## 2012-04-18 ENCOUNTER — Ambulatory Visit: Payer: PRIVATE HEALTH INSURANCE | Admitting: Family Medicine

## 2012-04-19 ENCOUNTER — Ambulatory Visit (HOSPITAL_COMMUNITY)
Admission: RE | Admit: 2012-04-19 | Discharge: 2012-04-19 | Disposition: A | Discharge status: Home / Self Care | Payer: PRIVATE HEALTH INSURANCE | Source: Ambulatory Visit | Attending: Obstetrics and Gynecology | Admitting: Obstetrics and Gynecology

## 2012-04-19 ENCOUNTER — Emergency Department (HOSPITAL_COMMUNITY)
Admission: EM | Admit: 2012-04-19 | Discharge: 2012-04-19 | Disposition: A | Discharge status: Home / Self Care | Payer: Self-pay | Source: Home / Self Care | Attending: Family Medicine | Admitting: Family Medicine

## 2012-04-19 ENCOUNTER — Ambulatory Visit (HOSPITAL_COMMUNITY)
Admission: RE | Admit: 2012-04-19 | Discharge: 2012-04-19 | Disposition: A | Discharge status: Home / Self Care | Payer: Self-pay | Source: Ambulatory Visit | Attending: Obstetrics and Gynecology | Admitting: Obstetrics and Gynecology

## 2012-04-19 ENCOUNTER — Encounter (HOSPITAL_COMMUNITY): Payer: Self-pay

## 2012-04-19 VITALS — BP 118/62 | Temp 98.2°F | Ht 61.0 in | Wt 153.0 lb

## 2012-04-19 DIAGNOSIS — Z1239 Encounter for other screening for malignant neoplasm of breast: Secondary | ICD-10-CM

## 2012-04-19 DIAGNOSIS — Z1231 Encounter for screening mammogram for malignant neoplasm of breast: Secondary | ICD-10-CM

## 2012-04-19 NOTE — ED Notes (Signed)
Follow up- was seen at urgent care for blood in urine-having some pain on the left lower back

## 2012-04-19 NOTE — Progress Notes (Signed)
No complaints today.  Pap Smear:    Pap smear not completed today. Last Pap smear was 11/12/2008 at the free cervical cancer screening at the [facility] and normal. Per patient has no history of abnormal Pap smears. Patient has a history of a hysterectomy for AUB in 1997. Pap smear result above in EPIC.  Physical exam: Breasts Breasts symmetrical. No skin abnormalities bilateral breasts. No nipple retraction bilateral breasts. No nipple discharge bilateral breasts. No lymphadenopathy. No lumps palpated bilateral breasts. No complaints of pain or tenderness on exam. Patient escorted to mammography for a screening mammogram.        Pelvic/Bimanual No Pap smear completed today since patient has a history of a hysterectomy for benign reasons. Pap smear not indicated per BCCCP guidelines.

## 2012-04-19 NOTE — Patient Instructions (Signed)
Taught patient how to perform BSE. Patient did not need a Pap smear today due to a history of a hysterectomy for benign reasons. Let her know that she will not need any further Pap smears due to a history of a hysterectomy. Let patient know will follow up with her within the next couple weeks with results by letter or phone. Patient escorted to mammography for a screening mammogram. Smoking cessation discussed with patient and patient signed up for the [employer]. Patient verbalized understanding.

## 2012-04-21 ENCOUNTER — Encounter: Payer: Self-pay | Admitting: Family Medicine

## 2012-04-21 ENCOUNTER — Ambulatory Visit (INDEPENDENT_AMBULATORY_CARE_PROVIDER_SITE_OTHER): Payer: Self-pay | Admitting: Family Medicine

## 2012-04-21 VITALS — BP 100/60 | HR 60 | Ht 60.0 in | Wt 152.0 lb

## 2012-04-21 DIAGNOSIS — F17200 Nicotine dependence, unspecified, uncomplicated: Secondary | ICD-10-CM

## 2012-04-21 DIAGNOSIS — E559 Vitamin D deficiency, unspecified: Secondary | ICD-10-CM

## 2012-04-21 DIAGNOSIS — N390 Urinary tract infection, site not specified: Secondary | ICD-10-CM

## 2012-04-21 DIAGNOSIS — R7301 Impaired fasting glucose: Secondary | ICD-10-CM

## 2012-04-21 LAB — POCT URINALYSIS DIPSTICK
Bilirubin, UA: NEGATIVE
Leukocytes, UA: NEGATIVE
Nitrite, UA: NEGATIVE
Protein, UA: NEGATIVE
Urobilinogen, UA: NEGATIVE
pH, UA: 5

## 2012-04-21 LAB — POCT GLYCOSYLATED HEMOGLOBIN (HGB A1C): Hemoglobin A1C: 5.7

## 2012-04-21 NOTE — Progress Notes (Signed)
Chief Complaint  Patient presents with  . Follow-up    6 month follow up, recheck A1c and vitamin D-pt is fasting.   HPI:  Patient presents for 6 month f/u.  She had borderline A1c at last visit (and elevated glucose, but I believe it was a nonfasting specimen). She cut back on sugar in her diet, eating less bread.  Cut back on desserts.  She has lost her insurance since her last visit here.  Vitamin D deficiency--She completed the vitamin D rx.  While she was taking that her energy was better.  Since switching to OTC Vitamin D daily, she doesn't feel as good as when she was on the prescription.    When she was here 6 months ago, she was complaining of fatigue, and hungry/nausea after eating.  Having ongoing fatigue. The hungry/nausea feeling has resolved.   ED visit 1/23 for UTI, treated with Keflex and tramadol. She left without being seen when went back for a recheck at Urgent Care, after a 3 hour wait.  Having ongoing "pressure"/strain after voiding.  Back pain resolved with antibiotics.  No further hematuria.  04/19/12 breast exam done (was told pelvic exam wasn't needed)  Cut back on smoking--now 5-6/day.  Past Medical History  Diagnosis Date  . Bell's palsy 03/10/2011    diagnosed in [place]. ongoing R sided facial weakness   Past Surgical History  Procedure Date  . Total abdominal hysterectomy w/ bilateral salpingoophorectomy 1995  . Abdominal hysterectomy    History   Social History  . Marital Status: Married    Spouse Name: N/A    Number of Children: N/A  . Years of Education: N/A   Occupational History  . parts Nature conservation officer   Social History Main Topics  . Smoking status: Current Every Day Smoker -- 0.5 packs/day for 41 years    Types: Cigarettes  . Smokeless tobacco: Never Used     Comment: quit x 1.5 years, restarted 06/2011  . Alcohol Use: Yes     Comment: 2 drinks per month, maybe.  . Drug Use: No  . Sexually Active: Not on file    Other Topics Concern  . Not on file   Social History Narrative   Widowed and re-married. No children    Current outpatient prescriptions:cholecalciferol (VITAMIN D) 1000 UNITS tablet, Take 1,000 Units by mouth daily., Disp: , Rfl:   No Known Allergies  ROS:  +fatigue.  Denies fevers, back pain, URI symptoms, chest pain, palpitations, shortness of breath, nausea, vomiting.  +urinary complaints per HPI.  See HPI  PHYSICAL EXAM: BP 100/60  Pulse 60  Ht 5' (1.524 m)  Wt 152 lb (68.947 kg)  BMI 29.69 kg/m2 Pleasant overweight female in no distress Neck: no lymphadenopathy or mass Heart: regular rate and rhythm Lungs: clear bilaterally Back: no CVA tenderness Abdomen: nontender, soft, no mass Extremities: no edema Neuro: R sided facial weakness, unchanged.  Otherwise strength, gait normal  Lab Results  Component Value Date   HGBA1C 5.7 04/21/2012   ASSESSMENT/PLAN: 1. Impaired fasting glucose  HgB A1c  2. Tobacco use disorder    3. Unspecified vitamin D deficiency    4. UTI (lower urinary tract infection)  POCT Urinalysis Dipstick   Sugars improved with dietary changes.  Encouraged continued low carb, lowfat diet, weight loss, daily exercise.  Recent UTI, some residual symptoms, but back pain and hematuria resolved. Recheck urine dip today.  Vitamin D--hold off on recheck today due to lack  of insurance.  Increase OTC Vitamin D to 2000 IU daily.  Counseled re: smoking. Encouraged quit date for spring/summer.  Exercise at least 30 minutes most days of the week (goal >150 minutes).  When has insurance, consider checking lipids. Thyroid should be checked yearly, lipids, glucose (fasting).  F/u 6 months, but can be postponed if still without insurance.  Instructed on Kegel's exercises.

## 2012-04-21 NOTE — Patient Instructions (Signed)
Increase vitamin D3 to 2000 IU everyday. Do your Kegel's exercises. We will call if it looks like there is ongoing urinary infection. Try and exercise at least daily (150 minutes/week or more). Try and continue to work on quitting smoking--set a quit date for later this spring/summer.  Return in 6 months--okay to delay if still without insurance

## 2012-04-25 ENCOUNTER — Telehealth: Payer: Self-pay | Admitting: *Deleted

## 2012-04-25 NOTE — Telephone Encounter (Signed)
Message copied by Melonie Florida on Mon Apr 25, 2012 11:58 AM ------      Message from: KNAPP, EVE      Created: Thu Apr 21, 2012 10:14 PM      Regarding: urine results       I accidentally "reviewed" results, so couldn't send you result note.  Please call her and let her know that her urine didn't show any ongoing evidence of urinary infection.  Do the Kegel's as we discussed to help with her urinary symptoms.  It is recommend that she return for pelvic exam (looking for cystocele or prolapse) if she has persistent or worsening pelvic pain/urinary complaints ------

## 2012-04-25 NOTE — Telephone Encounter (Signed)
Pt called.  I advised of Dr. Delford Field findings and recommendations.  Pt understood.

## 2012-04-25 NOTE — Telephone Encounter (Signed)
Called patient and left message for pt to return my call.

## 2012-06-08 ENCOUNTER — Encounter: Payer: Self-pay | Admitting: Family Medicine

## 2012-06-08 ENCOUNTER — Ambulatory Visit (INDEPENDENT_AMBULATORY_CARE_PROVIDER_SITE_OTHER): Payer: Self-pay | Admitting: Family Medicine

## 2012-06-08 VITALS — BP 140/80 | HR 76 | Temp 98.9°F | Ht 60.0 in | Wt 157.0 lb

## 2012-06-08 DIAGNOSIS — F17200 Nicotine dependence, unspecified, uncomplicated: Secondary | ICD-10-CM

## 2012-06-08 DIAGNOSIS — K047 Periapical abscess without sinus: Secondary | ICD-10-CM

## 2012-06-08 MED ORDER — ACETAMINOPHEN-CODEINE #3 300-30 MG PO TABS: 1.0000 | ORAL_TABLET | Freq: Four times a day (QID) | ORAL | Status: DC | PRN

## 2012-06-08 MED ORDER — PENICILLIN V POTASSIUM 500 MG PO TABS: 500.0000 mg | ORAL_TABLET | Freq: Four times a day (QID) | ORAL | Status: DC

## 2012-06-08 NOTE — Progress Notes (Signed)
Chief Complaint  Patient presents with  . Facial Swelling    since last evening-woke up with swelling on her left lower side of face, very painful and feels like a knot.   Ate popcorn yesterday, felt like "husk" got stuck.  She was able to pick it out, but it remained sore.  Started to notice a little swelling yesterday, but woke up 5pm last night with significant swelling of left side of face.  Denies fevers.  Having pain in left lower jaw.  Pain with chewing.  Doesn't have a dentist.  Last dental visit was 1 year ago.  Past Medical History  Diagnosis Date  . Bell's palsy 03/10/2011    diagnosed in [place]. ongoing R sided facial weakness   Current Outpatient Prescriptions on File Prior to Visit  Medication Sig Dispense Refill  . cholecalciferol (VITAMIN D) 1000 UNITS tablet Take 1,000 Units by mouth daily.       No current facility-administered medications on file prior to visit.   No Known Allergies  ROS:  Denies fevers, nausea, vomiting, diarrhea, rash, headache or other concerns except as per HPI.  PHYSICAL EXAM: BP 140/80  Pulse 76  Temp(Src) 98.9 F (37.2 C) (Tympanic)  Ht 5' (1.524 m)  Wt 157 lb (71.215 kg)  BMI 30.66 kg/m2  Well developed female, who appears to be in mild discomfort.  Significant swelling of L side of jaw noted OP: tooth decay noted.  Soft tissue swelling noted in lower jaw, very tender to touch.  No erythema noted, firm, not fluctuant. Diffusely swollen and tender in lower jaw. Neck: no significant lymphadenopathy noted   ASSESSMENT/PLAN: Dental abscess - Plan: penicillin v potassium (VEETID) 500 MG tablet, acetaminophen-codeine (TYLENOL #3) 300-30 MG per tablet  Tobacco use disorder  Pt information given.  Discussed ibuprofen, salt water gargles, and tylenol #3 just as needed for severe pain.  Risks/side effects reviewed. Pt understands need to f/u with dentist (within the next few days if not improving with this treatment, and if she does  get resolution with this treatment, still understands the need to f/u with dentist)

## 2012-06-08 NOTE — Patient Instructions (Addendum)
Take the antibiotics as directed.  If your swelling and pain isn't improving in the next 2 days (or if continuing to get worse), you will need to schedule an appointment with your dentist to evaluate this abscess--it may need drainage.  We can try the antibiotics first to see if that can improve the infection.  Either way, you will need to follow up with your dentist. The pain medication may make you drowsy--use with caution, do not drive.  Increase the ibuprofen dose up to 4 tablets (800mg ) three times daily with food to help with pain and inflammation.  Dental Abscess A dental abscess is a collection of infected fluid (pus) from a bacterial infection in the inner part of the tooth (pulp). It usually occurs at the end of the tooth's root.  CAUSES   Severe tooth decay.  Trauma to the tooth that allows bacteria to enter into the pulp, such as a broken or chipped tooth. SYMPTOMS   Severe pain in and around the infected tooth.  Swelling and redness around the abscessed tooth or in the mouth or face.  Tenderness.  Pus drainage.  Bad breath.  Bitter taste in the mouth.  Difficulty swallowing.  Difficulty opening the mouth.  Nausea.  Vomiting.  Chills.  Swollen neck glands. DIAGNOSIS   A medical and dental history will be taken.  An examination will be performed by tapping on the abscessed tooth.  X-rays may be taken of the tooth to identify the abscess. TREATMENT The goal of treatment is to eliminate the infection. You may be prescribed antibiotic medicine to stop the infection from spreading. A root canal may be performed to save the tooth. If the tooth cannot be saved, it may be pulled (extracted) and the abscess may be drained.  HOME CARE INSTRUCTIONS  Only take over-the-counter or prescription medicines for pain, fever, or discomfort as directed by your caregiver.  Rinse your mouth (gargle) often with salt water ( tsp salt in 8 oz of warm water) to relieve pain or  swelling.  Do not drive after taking pain medicine (narcotics).  Do not apply heat to the outside of your face.  Return to your dentist for further treatment as directed. SEEK MEDICAL CARE IF:  Your pain is not helped by medicine.  Your pain is getting worse instead of better. SEEK IMMEDIATE MEDICAL CARE IF:  You have a fever or persistent symptoms for more than 2 3 days.  You have a fever and your symptoms suddenly get worse.  You have chills or a very bad headache.  You have problems breathing or swallowing.  You have trouble opening your mouth.  You have swelling in the neck or around the eye. Document Released: 03/02/2005 Document Revised: 09/01/2011 Document Reviewed: 06/10/2010 [facility] Patient Information 2013 [place], [place].   Please try and quit smoking--start thinking about why/when you smoke (habit, boredom, stress) in order to come up with effective strategies to cut back or quit. Available resources to help you quit include free counseling through [facility] Quitline ([place]Quitline.com or 1-800-QUITNOW), smoking cessation classes through [facility] (call to find out schedule), over-the-counter nicotine replacements, and e-cigarettes (although this may not help break the hand-mouth habit).  Many insurance companies also have smoking cessation programs (which may decrease the cost of patches, meds if enrolled).  If these methods are not effective for you, and you are motivated to quit, return to discuss the possibility of prescription medications.

## 2013-04-17 ENCOUNTER — Telehealth: Payer: Self-pay | Admitting: Family Medicine

## 2013-04-17 NOTE — Telephone Encounter (Signed)
Melissa, please call patient regarding her bill pt Ph [phone]

## 2013-04-28 ENCOUNTER — Other Ambulatory Visit: Payer: Self-pay | Admitting: Family Medicine

## 2013-04-28 DIAGNOSIS — Z1231 Encounter for screening mammogram for malignant neoplasm of breast: Secondary | ICD-10-CM

## 2013-05-02 ENCOUNTER — Ambulatory Visit (HOSPITAL_COMMUNITY): Payer: 59

## 2013-05-04 ENCOUNTER — Ambulatory Visit (HOSPITAL_COMMUNITY): Payer: Self-pay

## 2013-05-09 ENCOUNTER — Ambulatory Visit (HOSPITAL_COMMUNITY)
Admission: RE | Admit: 2013-05-09 | Discharge: 2013-05-09 | Disposition: A | Discharge status: Home / Self Care | Payer: 59 | Source: Ambulatory Visit | Attending: Family Medicine | Admitting: Family Medicine

## 2013-05-09 ENCOUNTER — Ambulatory Visit (HOSPITAL_COMMUNITY): Payer: 59

## 2013-05-09 DIAGNOSIS — Z1231 Encounter for screening mammogram for malignant neoplasm of breast: Secondary | ICD-10-CM

## 2013-05-15 ENCOUNTER — Ambulatory Visit (INDEPENDENT_AMBULATORY_CARE_PROVIDER_SITE_OTHER): Payer: 59 | Admitting: Family Medicine

## 2013-05-15 ENCOUNTER — Encounter: Payer: Self-pay | Admitting: Family Medicine

## 2013-05-15 ENCOUNTER — Ambulatory Visit: Payer: Self-pay | Admitting: Family Medicine

## 2013-05-15 VITALS — BP 152/90 | HR 80 | Temp 98.8°F | Ht 61.0 in | Wt 149.0 lb

## 2013-05-15 DIAGNOSIS — H9202 Otalgia, left ear: Secondary | ICD-10-CM

## 2013-05-15 DIAGNOSIS — J029 Acute pharyngitis, unspecified: Secondary | ICD-10-CM

## 2013-05-15 DIAGNOSIS — H9209 Otalgia, unspecified ear: Secondary | ICD-10-CM

## 2013-05-15 DIAGNOSIS — H60399 Other infective otitis externa, unspecified ear: Secondary | ICD-10-CM

## 2013-05-15 DIAGNOSIS — F17200 Nicotine dependence, unspecified, uncomplicated: Secondary | ICD-10-CM

## 2013-05-15 LAB — POCT RAPID STREP A (OFFICE): RAPID STREP A SCREEN: NEGATIVE

## 2013-05-15 MED ORDER — NEOMYCIN-POLYMYXIN-HC 3.5-10000-1 OT SOLN: 4.0000 [drp] | Freq: Three times a day (TID) | OTIC | Status: DC

## 2013-05-15 NOTE — Progress Notes (Signed)
Chief Complaint  Patient presents with  . sick    ear drainage since friday, coughinfriday and breaks out in a sweat, sore throat    3 days ago she started with sore throat, then she started noticing drainage out of her left ear--it is a clear drainage.  No bleeding, odor.  Ear is itchy and painful.  Both ears get dry and scabby on the inside.  Denies any runny nose, sneezing. +complaining of cough, all day long.  Cough is productive of clear phlegm, just since Friday.  She used some warm tea, honey with some improvement.  Taking ibuprofen as needed for pain/fever, no other meds.  She had Tmax of 98.7.  Denies chills.  Past Medical History  Diagnosis Date  . Bell's palsy 03/10/2011    diagnosed in [place]. ongoing R sided facial weakness   Past Surgical History  Procedure Laterality Date  . Total abdominal hysterectomy w/ bilateral salpingoophorectomy  1995  . Abdominal hysterectomy     History   Social History  . Marital Status: Legally Separated    Spouse Name: N/A    Number of Children: N/A  . Years of Education: N/A   Occupational History  . parts Retail banker   Social History Main Topics  . Smoking status: Current Every Day Smoker -- 0.50 packs/day for 41 years    Types: Cigarettes  . Smokeless tobacco: Never Used     Comment: quit x 1.5 years, restarted 06/2011  . Alcohol Use: Yes     Comment: 2 drinks per month, maybe.  . Drug Use: No  . Sexual Activity: Not on file   Other Topics Concern  . Not on file   Social History Narrative   Widowed and re-married, and then divorced. No children   Meds: ibuprofen as needed for pain; not taking any other medications or vitamins No Known Allergies  ROS:  No fevers (low grade, subjective, only), headache, dizziness, chest pain, shortness of breath. Denies vomiting or diarrhea, some nausea.  No bleeding, bruising, rash.  No urinary complaints, hearing loss or other concerns.  See HPI  PHYSICAL  EXAM: BP 152/90  Pulse 80  Temp(Src) 98.8 F (37.1 C) (Oral)  Ht 5\' 1"  (1.549 m)  Wt 149 lb (67.586 kg)  BMI 28.17 kg/m2  Well developed, pleasant female in no distress HEENT:  PERRL, EOMI, conjunctiva clear.  TM's normal bilaterally.  EAC's are normal, some flaking and slight inflammation L>R, but no erythema.  No pain with movement of external ear.  There is crusting externally, (distal portion of ear canal) and tenderness.  No drainage is noted. OP:  There is some mild erythema L>R tonsil Neck: no lymphadenopathy or mass Heart: regular rate and rhythm without murmur Lungs: clear bilaterally  ASSESSMENT/PLAN:  Otitis, externa, infective - possible early infection, distally in canal. HC will help with itching in both ears - Plan: neomycin-polymyxin-hydrocortisone (CORTISPORIN) otic solution  Tobacco use disorder - counseled re: cessation, has been successful in past. encouraged to try quitting again  Acute pharyngitis - likely viral.  negative rapid strep - Plan: Rapid Strep A  Otalgia of left ear   Use cortisporin otic bilaterally--possibly early infection (mainly externally), with itching--the HC will help with that.  Use topical antibacterial ointment TID  Supportive measures for URI symptoms reviewed.  F/u next week for recheck if not improved.

## 2013-05-15 NOTE — Patient Instructions (Addendum)
Use the ear drops to treat potential external ear infection--and the hydrocortisone in the drop should help with the itching. You may use in both ears, if you would like to try it that way, although the left ear is the one that might have early infection.  Use a topical antibacterial ointment (neosporin or bacitracin) three times daily to the outside portion of the left ear, where it is crusted.  Return next week for recheck if not improving.  Continue honey and tea as needed for sore throat and cough.  You can also use decongestants (but check your blood pressure--it was a little high today, and decongestants can raise it further).  You can also try salt water gargles, and robitussin DM or Mucinex DM as needed for cough.  Please try and quit smoking.  Smoking will increase the likelihood of you developing a bacterial infection (such as bronchitis or sinus infection).  Please try and quit smoking--start thinking about why/when you smoke (habit, boredom, stress) in order to come up with effective strategies to cut back or quit. Available resources to help you quit include free counseling through [facility] Quitline ([place]Quitline.com or 1-800-QUITNOW), smoking cessation classes through [facility] (call to find out schedule), over-the-counter nicotine replacements, and e-cigarettes (although this may not help break the hand-mouth habit).  Many insurance companies also have smoking cessation programs (which may decrease the cost of patches, meds if enrolled).  If these methods are not effective for you, and you are motivated to quit, return to discuss the possibility of prescription medications.

## 2013-05-22 ENCOUNTER — Telehealth: Payer: Self-pay | Admitting: Family Medicine

## 2013-05-22 NOTE — Telephone Encounter (Signed)
She was told to f/u for recheck in 1 week if not improved. .  She can try OTC cough medication such as Robitussin DM or Mucinex DM.  She can use tylenol and/or advil as needed for pain, along with salt water gargles.  Schedule OV if not improving.  Please have her refer to the after-visit summary for details on the recommendations that were given to pt at her visit (she should read these again)

## 2013-05-22 NOTE — Telephone Encounter (Signed)
Patient has already done these things. She will come in 05/24/13.

## 2013-05-24 ENCOUNTER — Ambulatory Visit: Payer: Self-pay | Admitting: Family Medicine

## 2013-05-26 ENCOUNTER — Encounter (HOSPITAL_COMMUNITY): Payer: Self-pay | Admitting: Emergency Medicine

## 2013-05-26 ENCOUNTER — Emergency Department (HOSPITAL_COMMUNITY)
Admission: EM | Admit: 2013-05-26 | Discharge: 2013-05-26 | Disposition: A | Discharge status: Home / Self Care | Payer: 59 | Source: Home / Self Care | Attending: Family Medicine | Admitting: Family Medicine

## 2013-05-26 DIAGNOSIS — J40 Bronchitis, not specified as acute or chronic: Secondary | ICD-10-CM

## 2013-05-26 MED ORDER — ALBUTEROL SULFATE HFA 108 (90 BASE) MCG/ACT IN AERS: 2.0000 | INHALATION_SPRAY | Freq: Four times a day (QID) | RESPIRATORY_TRACT | Status: DC | PRN

## 2013-05-26 MED ORDER — IPRATROPIUM BROMIDE 0.06 % NA SOLN: 2.0000 | Freq: Four times a day (QID) | NASAL | Status: DC

## 2013-05-26 MED ORDER — PREDNISONE 10 MG PO TABS: 30.0000 mg | ORAL_TABLET | Freq: Every day | ORAL | Status: DC

## 2013-05-26 MED ORDER — IPRATROPIUM-ALBUTEROL 0.5-2.5 (3) MG/3ML IN SOLN
RESPIRATORY_TRACT | Status: AC
  Filled 2013-05-26: qty 3

## 2013-05-26 MED ORDER — AZITHROMYCIN 250 MG PO TABS: 250.0000 mg | ORAL_TABLET | Freq: Every day | ORAL | Status: DC

## 2013-05-26 MED ORDER — IPRATROPIUM-ALBUTEROL 0.5-2.5 (3) MG/3ML IN SOLN
3.0000 mL | Freq: Once | RESPIRATORY_TRACT | Status: AC
  Administered 2013-05-26: 3 mL via RESPIRATORY_TRACT

## 2013-05-26 MED ORDER — GUAIFENESIN-CODEINE 100-10 MG/5ML PO SOLN: 5.0000 mL | Freq: Every evening | ORAL | Status: DC | PRN

## 2013-05-26 NOTE — Discharge Instructions (Signed)
Thank you for coming in today. Take medications as directed. Come back as needed. Call or go to the emergency room if you get worse, have trouble breathing, have chest pains, or palpitations.   Bronchitis Bronchitis is inflammation of the airways that extend from the windpipe into the lungs (bronchi). The inflammation often causes mucus to develop, which leads to a cough. If the inflammation becomes severe, it may cause shortness of breath. CAUSES  Bronchitis may be caused by:   Viral infections.   Bacteria.   Cigarette smoke.   Allergens, pollutants, and other irritants.  SIGNS AND SYMPTOMS  The most common symptom of bronchitis is a frequent cough that produces mucus. Other symptoms include:  Fever.   Body aches.   Chest congestion.   Chills.   Shortness of breath.   Sore throat.  DIAGNOSIS  Bronchitis is usually diagnosed through a medical history and physical exam. Tests, such as chest X-rays, are sometimes done to rule out other conditions.  TREATMENT  You may need to avoid contact with whatever caused the problem (smoking, for example). Medicines are sometimes needed. These may include:  Antibiotics. These may be prescribed if the condition is caused by bacteria.  Cough suppressants. These may be prescribed for relief of cough symptoms.   Inhaled medicines. These may be prescribed to help open your airways and make it easier for you to breathe.   Steroid medicines. These may be prescribed for those with recurrent (chronic) bronchitis. HOME CARE INSTRUCTIONS  Get plenty of rest.   Drink enough fluids to keep your urine clear or pale yellow (unless you have a medical condition that requires fluid restriction). Increasing fluids may help thin your secretions and will prevent dehydration.   Only take over-the-counter or prescription medicines as directed by your health care provider.  Only take antibiotics as directed. Make sure you finish them even  if you start to feel better.  Avoid secondhand smoke, irritating chemicals, and strong fumes. These will make bronchitis worse. If you are a smoker, quit smoking. Consider using nicotine gum or skin patches to help control withdrawal symptoms. Quitting smoking will help your lungs heal faster.   Put a cool-mist humidifier in your bedroom at night to moisten the air. This may help loosen mucus. Change the water in the humidifier daily. You can also run the hot water in your shower and sit in the bathroom with the door closed for 5 10 minutes.   Follow up with your health care provider as directed.   Wash your hands frequently to avoid catching bronchitis again or spreading an infection to others.  SEEK MEDICAL CARE IF: Your symptoms do not improve after 1 week of treatment.  SEEK IMMEDIATE MEDICAL CARE IF:  Your fever increases.  You have chills.   You have chest pain.   You have worsening shortness of breath.   You have bloody sputum.  You faint.  You have lightheadedness.  You have a severe headache.   You vomit repeatedly. MAKE SURE YOU:   Understand these instructions.  Will watch your condition.  Will get help right away if you are not doing well or get worse. Document Released: 03/02/2005 Document Revised: 12/21/2012 Document Reviewed: 10/25/2012 [facility] Patient Information 2014 [place].

## 2013-05-26 NOTE — ED Provider Notes (Signed)
Summer Norris is a 63 y.o. female who presents to Urgent Care today for cough congestion sore throat and ear pain. This is been present for 2 weeks. Patient notes right ear fullness and pain associated with sore throat cough and nasal congestion. She was seen by her primary care provider in early March and given Cortisporin ear drops and advised to use salt water gargle and over-the-counter cough medications. These interventions have not worked. She denies any nausea vomiting or diarrhea or significant shortness of breath.   Past Medical History  Diagnosis Date  . Bell's palsy 03/10/2011    diagnosed in [place]. ongoing R sided facial weakness   History  Substance Use Topics  . Smoking status: Current Every Day Smoker -- 0.50 packs/day for 41 years    Types: Cigarettes  . Smokeless tobacco: Never Used     Comment: quit x 1.5 years, restarted 06/2011  . Alcohol Use: Yes     Comment: 2 drinks per month, maybe.   ROS as above Medications: No current facility-administered medications for this encounter.   Current Outpatient Prescriptions  Medication Sig Dispense Refill  . albuterol (PROVENTIL HFA;VENTOLIN HFA) 108 (90 BASE) MCG/ACT inhaler Inhale 2 puffs into the lungs every 6 (six) hours as needed for wheezing or shortness of breath.  1 Inhaler  2  . azithromycin (ZITHROMAX) 250 MG tablet Take 1 tablet (250 mg total) by mouth daily. Take first 2 tablets together, then 1 every day until finished.  6 tablet  0  . cholecalciferol (VITAMIN D) 1000 UNITS tablet Take 1,000 Units by mouth daily.      Marland Kitchen guaiFENesin-codeine 100-10 MG/5ML syrup Take 5 mLs by mouth at bedtime as needed for cough.  120 mL  0  . ibuprofen (ADVIL,MOTRIN) 200 MG tablet Take 600 mg by mouth every 6 (six) hours as needed for pain.      Marland Kitchen ipratropium (ATROVENT) 0.06 % nasal spray Place 2 sprays into both nostrils 4 (four) times daily.  15 mL  1  . neomycin-polymyxin-hydrocortisone (CORTISPORIN) otic solution Place 4  drops into the left ear 3 (three) times daily.  10 mL  0  . predniSONE (DELTASONE) 10 MG tablet Take 3 tablets (30 mg total) by mouth daily.  15 tablet  0    Exam:  BP 142/67  Pulse 64  Temp(Src) 98.4 F (36.9 C) (Oral)  Resp 16  SpO2 99% Gen: Well NAD HEENT: EOMI,  MMM right tympanic membrane is occluded by cerumen. Left is normal. Posterior pharynx with cobblestoning without exudate or significant erythema. No significant nasal discharge. Lungs: Normal work of breathing. Coarse breath sounds bilaterally Heart: RRR no MRG Abd: NABS, Soft. NT, ND Exts: Brisk capillary refill, warm and well perfused.   Cerumen was removed from the right ear and patient felt much better. Additionally patient was given a DuoNeb nebulizer treatment and felt much better  No results found for this or any previous visit (from the past 24 hour(s)). No results found.  Assessment and Plan: 63 y.o. female with bronchitis and cerumen impaction. Plan to treat bronchitis with prednisone Atrovent nasal spray, azithromycin, codeine containing cough medication, and albuterol.  Discussed warning signs or symptoms. Please see discharge instructions. Patient expresses understanding.    Gregor Hams, MD 05/26/13 [phone]

## 2013-05-26 NOTE — ED Notes (Signed)
Ear and sore throat that started 2 weeks ago.

## 2014-01-15 ENCOUNTER — Encounter (HOSPITAL_COMMUNITY): Payer: Self-pay | Admitting: Emergency Medicine

## 2014-03-16 DIAGNOSIS — M5412 Radiculopathy, cervical region: Secondary | ICD-10-CM

## 2014-03-16 DIAGNOSIS — G5600 Carpal tunnel syndrome, unspecified upper limb: Secondary | ICD-10-CM

## 2014-03-16 HISTORY — DX: Radiculopathy, cervical region: M54.12

## 2014-03-16 HISTORY — DX: Carpal tunnel syndrome, unspecified upper limb: G56.00

## 2014-05-09 ENCOUNTER — Other Ambulatory Visit (HOSPITAL_COMMUNITY): Payer: Self-pay | Admitting: Obstetrics & Gynecology

## 2014-05-09 ENCOUNTER — Other Ambulatory Visit: Payer: Self-pay | Admitting: Family Medicine

## 2014-05-09 DIAGNOSIS — Z1231 Encounter for screening mammogram for malignant neoplasm of breast: Secondary | ICD-10-CM

## 2014-05-10 ENCOUNTER — Ambulatory Visit (INDEPENDENT_AMBULATORY_CARE_PROVIDER_SITE_OTHER): Payer: 59 | Admitting: Family Medicine

## 2014-05-10 ENCOUNTER — Encounter: Payer: Self-pay | Admitting: Family Medicine

## 2014-05-10 VITALS — BP 130/72 | HR 72 | Temp 97.2°F | Ht 61.0 in | Wt 158.0 lb

## 2014-05-10 DIAGNOSIS — M545 Low back pain, unspecified: Secondary | ICD-10-CM

## 2014-05-10 DIAGNOSIS — R1084 Generalized abdominal pain: Secondary | ICD-10-CM

## 2014-05-10 LAB — POCT URINALYSIS DIPSTICK
Bilirubin, UA: NEGATIVE
GLUCOSE UA: NEGATIVE
Ketones, UA: NEGATIVE
LEUKOCYTES UA: NEGATIVE
NITRITE UA: NEGATIVE
Spec Grav, UA: >=1.03
UROBILINOGEN UA: NEGATIVE
pH, UA: 6

## 2014-05-10 MED ORDER — NAPROXEN 500 MG PO TABS: 500.0000 mg | ORAL_TABLET | Freq: Two times a day (BID) | ORAL | Status: DC

## 2014-05-10 NOTE — Progress Notes (Signed)
Chief Complaint  Patient presents with  . Back Pain    B/L lower pack pain that started this past Friday. Has been taking ibuprofen without much relief. Patient declined flu shot today.   She has pressure across her low back and around both sides. It starts mid-day, and continues through the evening.  It is hard to sleep at night due to the discomfort.  It seems to start after she drinks a lot and goes to the bathroom to void. Symptoms started 6 days ago--is about the same, hasn't gotten worse.  She has been taking ibuprofen 400mg  once daily, at 2-3 in the afternoon, and it helps a little, temporarily. She denies dysuria, urgency, incontinence or hematuria.  Denies any injury, fall, strains, new activity.  The legs feel "tired", but no radiation of the pain in the back down into the legs.  No numbness/tingling/weakness.    PMH, PSH, SH reviewed and updated. She continues to smoke.  Outpatient Encounter Prescriptions as of 05/10/2014  Medication Sig  . cholecalciferol (VITAMIN D) 1000 UNITS tablet Take 1,000 Units by mouth daily.  Marland Kitchen ibuprofen (ADVIL,MOTRIN) 200 MG tablet Take 600 mg by mouth every 6 (six) hours as needed for pain.  Marland Kitchen albuterol (PROVENTIL HFA;VENTOLIN HFA) 108 (90 BASE) MCG/ACT inhaler Inhale 2 puffs into the lungs every 6 (six) hours as needed for wheezing or shortness of breath. (Patient not taking: Reported on 05/10/2014)  . [DISCONTINUED] azithromycin (ZITHROMAX) 250 MG tablet Take 1 tablet (250 mg total) by mouth daily. Take first 2 tablets together, then 1 every day until finished.  . [DISCONTINUED] guaiFENesin-codeine 100-10 MG/5ML syrup Take 5 mLs by mouth at bedtime as needed for cough.  . [DISCONTINUED] ipratropium (ATROVENT) 0.06 % nasal spray Place 2 sprays into both nostrils 4 (four) times daily. (Patient not taking: Reported on 05/10/2014)  . [DISCONTINUED] neomycin-polymyxin-hydrocortisone (CORTISPORIN) otic solution Place 4 drops into the left ear 3 (three) times daily.   . [DISCONTINUED] predniSONE (DELTASONE) 10 MG tablet Take 3 tablets (30 mg total) by mouth daily.   No Known Allergies  ROS: Denies fevers, chills.  Has some mild nausea at night, related to being in pain. No vomiting, diarrhea.  Denies bleeding, bruising, rash. No abdominal pain. Denies chest pain, palpitations, irregular heartbeat.  Denies URI symptoms, cough, shortness of breath.  BP 130/72 mmHg  Pulse 72  Temp(Src) 97.2 F (36.2 C) (Tympanic)  Ht 5\' 1"  (1.549 m)  Wt 158 lb (71.668 kg)  BMI 29.87 kg/m2  Well developed, pleasant female in no distress Heart: regular rate and rhythm without murmur Lungs:  Clear bilaterally Abdomen: very mild vague discomfort along her lower abdomen bilaterally.  No rebound tenderness or guarding.  No mass.  No suprapubic tenderness, organomegaly or mass.  Negative Murphy. Back: no CVA tenderness, muscle spasm, spinal tenderness or SI tenderness.  Area of discomfort is across entire waist, around to the front--nontender to palpation Neuro: alert and oriented.  Chronic right-sided facial weakness.  DTR's in lower extremities are normal and symmetric (decreased at ankles).  Normal strength, sensation; negative SLR. Skin: no rash Psych: normal mood, affect, hygiene and grooming  Urine dip:  SG >1.030, trace protein, trace blood. Neg leukocytes  ASSESSMENT/PLAN:  Bilateral low back pain without sciatica - Plan: naproxen (NAPROSYN) 500 MG tablet  Bilateral low back pain, with sciatica presence unspecified - Plan: POCT Urinalysis Dipstick  Generalized abdominal pain  reassurred no evidence of UTI or kidney infection.  Advised to drink more fluid.  Trial  of heat/stretches, dietary changes in case GI/gas contributing to abdominal complaints. Return next week if not improving, for further eval, including pelvic, if needed. Advised to schedule CPE Counseled briefly on smoking cessation NSAID precautions were reviewed with the patient.  Patient should  take medication with food, discontinue if develops GI side effects, not to take other OTC NSAIDs at the same time, and not to use longer than recommended.   Trial of heat, stretches. Consider some stomach/gas component to the discomfort that radiates around to your stomach. Try avoid gas-producing foods for a trial--ie beans, raw vegetables, perhaps even avoid dairy for a few days (for those that are lactose-intolerant, this can cause bloating/gas). Try Simethicone (ie Gas-X) when you have the abdominal discomfort to see if this helps ease the pain.  STOP the ibuprofen.  Instead, take the prescription twice daily with food until your feel like your pain is completely better (up to the full 2 weeks, if needed, but stop as soon as you are entirely better, using it twice daily until then).   If pain persists/worsens, return for further eval and discussion.  We might need to send you for x-rays if the pain is predominantly in the back.  If it is more pelvic/abdominal, we will do a pelvic exam.  Schedule CPE

## 2014-05-10 NOTE — Patient Instructions (Addendum)
  Trial of heat, stretches. Consider some stomach/gas component to the discomfort that radiates around to your stomach. Try avoid gas-producing foods for a trial--ie beans, raw vegetables, perhaps even avoid dairy for a few days (for those that are lactose-intolerant, this can cause bloating/gas). Try Simethicone (ie Gas-X) when you have the abdominal discomfort to see if this helps ease the pain.  STOP the ibuprofen.  Instead, take the prescription twice daily with food until your feel like your pain is completely better (up to the full 2 weeks, if needed, but stop as soon as you are entirely better, using it twice daily until then).   If pain persists/worsens, return for further eval and discussion.  We might need to send you for x-rays if the pain is predominantly in the back.  If it is more pelvic/abdominal, we will do a pelvic exam.  Please try and quit smoking--start thinking about why/when you smoke (habit, boredom, stress) in order to come up with effective strategies to cut back or quit. Available resources to help you quit include free counseling through [facility] Quitline ([place]Quitline.com or 1-800-QUITNOW), smoking cessation classes through [facility] (call to find out schedule), over-the-counter nicotine replacements, and e-cigarettes (although this may not help break the hand-mouth habit).  Many insurance companies also have smoking cessation programs (which may decrease the cost of patches, meds if enrolled).  If these methods are not effective for you, and you are motivated to quit, return to discuss the possibility of prescription medications.

## 2014-05-14 ENCOUNTER — Ambulatory Visit (HOSPITAL_COMMUNITY)
Admission: RE | Admit: 2014-05-14 | Discharge: 2014-05-14 | Disposition: A | Discharge status: Home / Self Care | Payer: 59 | Source: Ambulatory Visit | Attending: Obstetrics & Gynecology | Admitting: Obstetrics & Gynecology

## 2014-05-14 DIAGNOSIS — Z1231 Encounter for screening mammogram for malignant neoplasm of breast: Secondary | ICD-10-CM

## 2014-07-11 ENCOUNTER — Ambulatory Visit (INDEPENDENT_AMBULATORY_CARE_PROVIDER_SITE_OTHER): Payer: 59 | Admitting: Family Medicine

## 2014-07-11 ENCOUNTER — Encounter: Payer: Self-pay | Admitting: Family Medicine

## 2014-07-11 VITALS — BP 128/68 | HR 68 | Ht 60.5 in | Wt 154.4 lb

## 2014-07-11 DIAGNOSIS — K219 Gastro-esophageal reflux disease without esophagitis: Secondary | ICD-10-CM

## 2014-07-11 DIAGNOSIS — Z0000 Encounter for general adult medical examination without abnormal findings: Secondary | ICD-10-CM

## 2014-07-11 DIAGNOSIS — H5462 Unqualified visual loss, left eye, normal vision right eye: Secondary | ICD-10-CM

## 2014-07-11 DIAGNOSIS — Z720 Tobacco use: Secondary | ICD-10-CM

## 2014-07-11 DIAGNOSIS — R7301 Impaired fasting glucose: Secondary | ICD-10-CM

## 2014-07-11 DIAGNOSIS — E559 Vitamin D deficiency, unspecified: Secondary | ICD-10-CM | POA: Diagnosis not present

## 2014-07-11 DIAGNOSIS — F17200 Nicotine dependence, unspecified, uncomplicated: Secondary | ICD-10-CM

## 2014-07-11 LAB — COMPREHENSIVE METABOLIC PANEL
ALBUMIN: 4.4 g/dL (ref 3.5–5.2)
ALT: 13 U/L (ref 0–35)
AST: 17 U/L (ref 0–37)
Alkaline Phosphatase: 91 U/L (ref 39–117)
BUN: 11 mg/dL (ref 6–23)
CALCIUM: 9.8 mg/dL (ref 8.4–10.5)
CHLORIDE: 105 meq/L (ref 96–112)
CO2: 25 mEq/L (ref 19–32)
Creat: 0.86 mg/dL (ref 0.50–1.10)
Glucose, Bld: 86 mg/dL (ref 70–99)
POTASSIUM: 4.4 meq/L (ref 3.5–5.3)
Sodium: 141 mEq/L (ref 135–145)
TOTAL PROTEIN: 7.3 g/dL (ref 6.0–8.3)
Total Bilirubin: 0.8 mg/dL (ref 0.2–1.2)

## 2014-07-11 LAB — CBC WITH DIFFERENTIAL/PLATELET
BASOS ABS: 0 10*3/uL (ref 0.0–0.1)
Basophils Relative: 0 % (ref 0–1)
Eosinophils Absolute: 0.1 10*3/uL (ref 0.0–0.7)
Eosinophils Relative: 1 % (ref 0–5)
HEMATOCRIT: 44.7 % (ref 36.0–46.0)
HEMOGLOBIN: 15.1 g/dL — AB (ref 12.0–15.0)
LYMPHS ABS: 3 10*3/uL (ref 0.7–4.0)
LYMPHS PCT: 28 % (ref 12–46)
MCH: 29.3 pg (ref 26.0–34.0)
MCHC: 33.8 g/dL (ref 30.0–36.0)
MCV: 86.6 fL (ref 78.0–100.0)
MPV: 9.4 fL (ref 8.6–12.4)
Monocytes Absolute: 0.5 10*3/uL (ref 0.1–1.0)
Monocytes Relative: 5 % (ref 3–12)
NEUTROS ABS: 7 10*3/uL (ref 1.7–7.7)
NEUTROS PCT: 66 % (ref 43–77)
Platelets: 356 10*3/uL (ref 150–400)
RBC: 5.16 MIL/uL — ABNORMAL HIGH (ref 3.87–5.11)
RDW: 13.5 % (ref 11.5–15.5)
WBC: 10.6 10*3/uL — ABNORMAL HIGH (ref 4.0–10.5)

## 2014-07-11 LAB — TSH: TSH: 0.523 u[IU]/mL (ref 0.350–4.500)

## 2014-07-11 LAB — LIPID PANEL
Cholesterol: 169 mg/dL (ref 0–200)
HDL: 35 mg/dL — AB (ref >=46)
LDL CALC: 107 mg/dL — AB (ref 0–99)
TRIGLYCERIDES: 133 mg/dL (ref <150)
Total CHOL/HDL Ratio: 4.8 Ratio
VLDL: 27 mg/dL (ref 0–40)

## 2014-07-11 LAB — POCT URINALYSIS DIPSTICK
BILIRUBIN UA: NEGATIVE
Glucose, UA: NEGATIVE
KETONES UA: NEGATIVE
Leukocytes, UA: NEGATIVE
NITRITE UA: NEGATIVE
PH UA: 5.5
RBC UA: NEGATIVE
UROBILINOGEN UA: NEGATIVE

## 2014-07-11 NOTE — Progress Notes (Signed)
Chief Complaint  Patient presents with  . Annual Exam    fasting annual with pelvic. UA showed trace protein, no symptoms. No concerns.    Summer Norris is a 64 y.o. female who presents for a complete physical.  She has no particular concerns.  Vitamin D deficiency--she has been compliant with taking supplements. H/o impaired fasting glucose--hasn't been checked in over 2 years.  She reports gradual worsening of vision in her left eye--now extremely blurry.  She hasn't been to an eye doctor in many years.    Immunization History  Administered Date(s) Administered  . Tdap 10/19/2011  declines flu shots Last Pap smear: N/A, s/p hysterectomy (for scar tissue, benign) Last mammogram: 05/14/2014 Last colonoscopy: Recalls having had one 2008, reportedly normal (she states it was [place], no record in computer) Last DEXA: she recalls having one--no record in chart.  Believes it was normal (done at Dr. Sherlynn Stalls office) Dentist: last went 6 months ago (admits to not going routinely) Ophtho:  Many years ago Exercise:  Just at work--walking/on her feet at work; lifts some heavy items at work (20-40#).  Review of labs in chart: A1c 5.7 04/2012 (6.0 in 10/2011) 10/2011--normal c-met except glucose 112; WBC 11.6, Hg 15.1, Vitamin D 17, normal TSH  Past Medical History  Diagnosis Date  . Bell's palsy 03/10/2011    diagnosed in [place]. ongoing R sided facial weakness    Past Surgical History  Procedure Laterality Date  . Total abdominal hysterectomy w/ bilateral salpingoophorectomy  1995  . Abdominal hysterectomy      History   Social History  . Marital Status: Legally Separated    Spouse Name: N/A  . Number of Children: N/A  . Years of Education: N/A   Occupational History  . parts Retail banker   Social History Main Topics  . Smoking status: Current Every Day Smoker -- 0.50 packs/day for 41 years    Types: Cigarettes  . Smokeless tobacco: Never Used    Comment: quit x 1.5 years, restarted 06/2011  . Alcohol Use: 0.0 oz/week    0 Standard drinks or equivalent per week     Comment: 2 drinks per month, maybe.  . Drug Use: No  . Sexual Activity: Not Currently    Birth Control/ Protection: Surgical   Other Topics Concern  . Not on file   Social History Narrative   Widowed and re-married, and then divorced. No children. Lives alone    Family History  Problem Relation Age of Onset  . Diabetes Mother   . Heart disease Father   . Arthritis Sister   . Diabetes Sister     prediabetes  . Sarcoidosis Brother   . Heart disease Brother 75  . Diabetes Sister     prediabetes  . Cancer Neg Hx     Outpatient Encounter Prescriptions as of 07/11/2014  Medication Sig  . cholecalciferol (VITAMIN D) 1000 UNITS tablet Take 1,000 Units by mouth daily.  Marland Kitchen albuterol (PROVENTIL HFA;VENTOLIN HFA) 108 (90 BASE) MCG/ACT inhaler Inhale 2 puffs into the lungs every 6 (six) hours as needed for wheezing or shortness of breath. (Patient not taking: Reported on 05/10/2014)  . ibuprofen (ADVIL,MOTRIN) 200 MG tablet Take 600 mg by mouth every 6 (six) hours as needed for pain.  . [DISCONTINUED] naproxen (NAPROSYN) 500 MG tablet Take 1 tablet (500 mg total) by mouth 2 (two) times daily with a meal.    No Known Allergies  ROS:  The patient  denies anorexia, fever, weight changes, headaches, decreased hearing, ear pain, sore throat, breast concerns, chest pain, palpitations, dizziness, syncope, dyspnea on exertion, cough, swelling, nausea, vomiting, diarrhea, constipation, abdominal pain, melena, hematochezia, hematuria, incontinence, dysuria, vaginal bleeding, discharge, odor or itch, genital lesions, joint pains, numbness, tingling, weakness, tremor, suspicious skin lesions, depression, anxiety, abnormal bleeding/bruising, or enlarged lymph nodes. Heartburn in the evenings, before bed, relieved by baking soda and water.  +tomato-based foods in diet. Blurry vision with  her left eye--hasn't seen eye doctor in many years. Dental pain--needs a tooth extracted  PHYSICAL EXAM:  BP 128/68 mmHg  Pulse 68  Ht 5' 0.5" (1.537 m)  Wt 154 lb 6.4 oz (70.035 kg)  BMI 29.65 kg/m2  General Appearance:    Alert, cooperative, no distress, appears stated age  Head:    Normocephalic, without obvious abnormality, atraumatic  Eyes:    PERRL, conjunctiva/corneas clear, EOM's intact; Left fundus not able to be examined--appeared dark, reflected back, unable to visualize. Right was normal  Ears:    Normal TM's and external ear canals  Nose:   Nares normal, mucosa normal, no drainage or sinus   tenderness  Throat:   Lips, mucosa, and tongue normal; Tooth decay (most notable in upper posterior teeth)  Neck:   Supple, no lymphadenopathy;  thyroid:  no   enlargement/tenderness/nodules; no carotid   bruit or JVD  Back:    Spine nontender, no curvature, ROM normal, no CVA     tenderness  Lungs:     Clear to auscultation bilaterally without wheezes, rales or     ronchi; respirations unlabored  Chest Wall:    No tenderness or deformity   Heart:    Regular rate and rhythm, S1 and S2 normal, no murmur, rub   or gallop  Breast Exam:    No tenderness, masses, or nipple discharge or inversion.      No axillary lymphadenopathy  Abdomen:     Soft, non-tender, nondistended, normoactive bowel sounds,    no masses, no hepatosplenomegaly. WHSS  Genitalia:    Normal external genitalia without lesions.  BUS and vagina normal; uterus surgically absent. No abnormal vaginal discharge. No adnexal masses. Pap not performed  Rectal:    Normal tone, no masses or tenderness; guaiac negative stool  Extremities:   No clubbing, cyanosis or edema  Pulses:   2+ and symmetric all extremities  Skin:   Skin color, texture, turgor normal, no rashes or lesions  Lymph nodes:   Cervical, supraclavicular, and axillary nodes normal  Neurologic:   CNII-XII intact, normal strength, sensation and gait; reflexes 2+ and  symmetric throughout          Psych:   Normal mood, affect, hygiene and grooming.    SG 1.030, trace protein, otherwise normal urine dip.  ASSESSMENT/PLAN:  Annual physical exam - Plan: Visual acuity screening, POCT Urinalysis Dipstick, CBC with Differential/Platelet, Lipid panel, Vit D  25 hydroxy (rtn osteoporosis monitoring), TSH, Comprehensive metabolic panel  Vitamin D deficiency - due for recheck.  continue supplementation - Plan: Vit D  25 hydroxy (rtn osteoporosis monitoring)  Tobacco use disorder - risks of smoking reviewed at length; encouraged cessation  Impaired fasting glucose - weight loss and daily exercise encouraged. labs due - Plan: Comprehensive metabolic panel, Hemoglobin A1c  Gastroesophageal reflux disease without esophagitis - diet reviewed. encouraged to lose weight, quit smoking, dietary changes.  Vision loss of left eye - refer to ophtho for evaluation    Discussed monthly self breast exams  and yearly mammograms; at least 30 minutes of aerobic activity at least 5 days/week; proper sunscreen use reviewed; healthy diet, including goals of calcium and vitamin D intake and alcohol recommendations (less than or equal to 1 drink/day) reviewed; regular seatbelt use; changing batteries in smoke detectors.  Immunization recommendations discussed, see below.  Colonoscopy recommendations reviewed, UTD per pt (due again 2018).  Yearly flu shots recommended (declined by patient). Pneumonia vaccines recommended due to smoking.  She declines. Discussed that if she quits, then can wait until age 58 for pneumonia vaccines. Shingles vaccine is recommended.  Risks/side effects discussed--to check with her insurance and return for nurse visit  CBC, c-met, lipid, TSH, A1c  F/u will depend on lab results

## 2014-07-11 NOTE — Patient Instructions (Addendum)
HEALTH MAINTENANCE RECOMMENDATIONS:  It is recommended that you get at least 30 minutes of aerobic exercise at least 5 days/week (for weight loss, you may need as much as 60-90 minutes). This can be any activity that gets your heart rate up. This can be divided in 10-15 minute intervals if needed, but try and build up your endurance at least once a week.  Weight bearing exercise is also recommended twice weekly.  Eat a healthy diet with lots of vegetables, fruits and fiber.  "Colorful" foods have a lot of vitamins (ie green vegetables, tomatoes, red peppers, etc).  Limit sweet tea, regular sodas and alcoholic beverages, all of which has a lot of calories and sugar.  Up to 1 alcoholic drink daily may be beneficial for women (unless trying to lose weight, watch sugars).  Drink a lot of water.  Calcium recommendations are 1200-1500 mg daily (1500 mg for postmenopausal women or women without ovaries), and vitamin D 1000 IU daily.  This should be obtained from diet and/or supplements (vitamins), and calcium should not be taken all at once, but in divided doses.  Monthly self breast exams and yearly mammograms for women over the age of 32 is recommended.  Sunscreen of at least SPF 30 should be used on all sun-exposed parts of the skin when outside between the hours of 10 am and 4 pm (not just when at beach or pool, but even with exercise, golf, tennis, and yard work!)  Use a sunscreen that says "broad spectrum" so it covers both UVA and UVB rays, and make sure to reapply every 1-2 hours.  Remember to change the batteries in your smoke detectors when changing your clock times in the spring and fall.  Use your seat belt every time you are in a car, and please drive safely and not be distracted with cell phones and texting while driving.  We will check and see if your insurance requires a referral for your eye exam.  If it does, we can take care of it.  If no referral is needed, you can call yourself--I  like Dr. Herbert Deaner, [facility], and Margot Ables (as just a few of the good eye doctors in town).  Please try and quit smoking--start thinking about why/when you smoke (habit, boredom, stress) in order to come up with effective strategies to cut back or quit. Available resources to help you quit include free counseling through [facility] ([place]Quitline.com or 1-800-QUITNOW), smoking cessation classes through [facility] (call to find out schedule), over-the-counter nicotine replacements, and e-cigarettes (although this may not help break the hand-mouth habit).  Many insurance companies also have smoking cessation programs (which may decrease the cost of patches, meds if enrolled).  If these methods are not effective for you, and you are motivated to quit, return to discuss the possibility of prescription medications.  Check with your insurance to see if the shingles vaccine is covered.  If it is, schedule a nurse visit to come back and get the vaccine. (just remember that you shouldn't have any vaccines within a month, before or after, this vaccine).  Food Choices for Gastroesophageal Reflux Disease When you have gastroesophageal reflux disease (GERD), the foods you eat and your eating habits are very important. Choosing the right foods can help ease the discomfort of GERD. WHAT GENERAL GUIDELINES DO I NEED TO FOLLOW?  Choose fruits, vegetables, whole grains, low-fat dairy products, and low-fat meat, fish, and poultry.  Limit fats such as oils, salad  dressings, butter, nuts, and avocado.  Keep a food diary to identify foods that cause symptoms.  Avoid foods that cause reflux. These may be different for different people.  Eat frequent small meals instead of three large meals each day.  Eat your meals slowly, in a relaxed setting.  Limit fried foods.  Cook foods using methods other than frying.  Avoid drinking alcohol.  Avoid drinking large amounts of  liquids with your meals.  Avoid bending over or lying down until 2-3 hours after eating. WHAT FOODS ARE NOT RECOMMENDED? The following are some foods and drinks that may worsen your symptoms: Vegetables Tomatoes. Tomato juice. Tomato and spaghetti sauce. Chili peppers. Onion and garlic. Horseradish. Fruits Oranges, grapefruit, and lemon (fruit and juice). Meats High-fat meats, fish, and poultry. This includes hot dogs, ribs, ham, sausage, salami, and bacon. Dairy Whole milk and chocolate milk. Sour cream. Cream. Butter. Ice cream. Cream cheese.  Beverages Coffee and tea, with or without caffeine. Carbonated beverages or energy drinks. Condiments Hot sauce. Barbecue sauce.  Sweets/Desserts Chocolate and cocoa. Donuts. Peppermint and spearmint. Fats and Oils High-fat foods, including [place] fries and potato chips. Other Vinegar. Strong spices, such as black pepper, white pepper, red pepper, cayenne, curry powder, cloves, ginger, and chili powder. The items listed above may not be a complete list of foods and beverages to avoid. Contact your dietitian for more information. Document Released: 03/02/2005 Document Revised: 03/07/2013 Document Reviewed: 01/04/2013 [facility] Patient Information 2015 [place], [place]. This information is not intended to replace advice given to you by your health care provider. Make sure you discuss any questions you have with your health care provider.

## 2014-07-12 ENCOUNTER — Other Ambulatory Visit: Payer: Self-pay | Admitting: *Deleted

## 2014-07-12 LAB — HEMOGLOBIN A1C
HEMOGLOBIN A1C: 6.1 % — AB (ref <5.7)
MEAN PLASMA GLUCOSE: 128 mg/dL — AB (ref <117)

## 2014-07-12 LAB — VITAMIN D 25 HYDROXY (VIT D DEFICIENCY, FRACTURES): VIT D 25 HYDROXY: 9 ng/mL — AB (ref 30–100)

## 2014-07-12 MED ORDER — VITAMIN D (ERGOCALCIFEROL) 1.25 MG (50000 UNIT) PO CAPS: 50000.0000 [IU] | ORAL_CAPSULE | ORAL | Status: DC

## 2014-08-06 ENCOUNTER — Telehealth: Payer: Self-pay | Admitting: Family Medicine

## 2014-08-06 NOTE — Telephone Encounter (Signed)
Left message for pt to call. appt on 07/15/2015 needs to be rescheduled.

## 2014-08-15 LAB — HM DIABETES EYE EXAM

## 2014-09-05 ENCOUNTER — Encounter: Payer: Self-pay | Admitting: Family Medicine

## 2014-09-13 ENCOUNTER — Encounter: Payer: Self-pay | Admitting: Internal Medicine

## 2014-09-18 ENCOUNTER — Telehealth: Payer: Self-pay | Admitting: Internal Medicine

## 2014-09-18 NOTE — Telephone Encounter (Signed)
If she has not previously seen them for this specific problem, then needs eval by me first

## 2014-09-18 NOTE — Telephone Encounter (Signed)
Pt states she has a rash on her face and would like to go to [facility] to be seen but needs a referral due to insurance. Is this okay to refer her

## 2014-09-19 NOTE — Telephone Encounter (Signed)
Left detailed message on pt VM about scheduling an appt here first if dermatology has not seen her for that.

## 2014-09-27 ENCOUNTER — Encounter: Payer: Self-pay | Admitting: Family Medicine

## 2014-09-27 ENCOUNTER — Ambulatory Visit (INDEPENDENT_AMBULATORY_CARE_PROVIDER_SITE_OTHER): Payer: 59 | Admitting: Family Medicine

## 2014-09-27 VITALS — BP 112/70 | HR 60 | Temp 97.7°F | Ht 60.5 in | Wt 154.8 lb

## 2014-09-27 DIAGNOSIS — B360 Pityriasis versicolor: Secondary | ICD-10-CM | POA: Diagnosis not present

## 2014-09-27 DIAGNOSIS — R21 Rash and other nonspecific skin eruption: Secondary | ICD-10-CM | POA: Diagnosis not present

## 2014-09-27 MED ORDER — KETOCONAZOLE 2 % EX CREA: TOPICAL_CREAM | CUTANEOUS | Status: DC

## 2014-09-27 NOTE — Progress Notes (Signed)
Chief Complaint  Patient presents with  . Rash    has some lighter places on her face x 2 weeks that seem to be spreading. Also has some places under her eyes that are new and different. No itching or pain associated with these.    She first noticed light patches of skin on the left side of her chin, and is spreading to the under part of the chin. No associated flaking, itching.  Denies any new products, creams, lotions.  She noticed this starting about 2 weeks ago.  She denies hypopigmentation elsewhere on body, and no dandruff or scalp concerns.  She noticed it a couple of weeks after eye surgery, wondering if it could be related.  PMH, PSH, SH reviewed.  Outpatient Encounter Prescriptions as of 09/27/2014  Medication Sig Note  . Vitamin D, Ergocalciferol, (DRISDOL) 50000 UNITS CAPS capsule Take 1 capsule (50,000 Units total) by mouth once a week. 09/27/2014: On last week of prescription  . [DISCONTINUED] Cholecalciferol (VITAMIN D3) 50000 UNITS CAPS Take 1 tablet by mouth once a week.  09/27/2014: Received from: External Pharmacy  . albuterol (PROVENTIL HFA;VENTOLIN HFA) 108 (90 BASE) MCG/ACT inhaler Inhale 2 puffs into the lungs every 6 (six) hours as needed for wheezing or shortness of breath. (Patient not taking: Reported on 05/10/2014)   . cholecalciferol (VITAMIN D) 1000 UNITS tablet Take 1,000 Units by mouth daily.   Marland Kitchen ibuprofen (ADVIL,MOTRIN) 200 MG tablet Take 600 mg by mouth every 6 (six) hours as needed for pain.    No facility-administered encounter medications on file as of 09/27/2014.   No Known Allergies  ROS:  No fever, chills, URI symptoms, bleeding, bruising, other skin concerns or other areas with hypopigmentation. No pruritis.  No GI or GU complaints.  PHYSICAL EXAM: BP 112/70 mmHg  Pulse 60  Temp(Src) 97.7 F (36.5 C) (Tympanic)  Ht 5' 0.5" (1.537 m)  Wt 154 lb 12.8 oz (70.217 kg)  BMI 29.72 kg/m2  Well developed, pleasant female in no distress Some chronic facial  weakness, unchanged.  Skin: Left chin (below lips, on facial portion) there is some patchy hypopigmentation.   Does not appear particularly dry or flaky. Minimal hypopigmentation seen on the lower portion of chin that patient points out. Remainder of her skin is completely clear.  ASSESSMENT/PLAN:  Facial rash - Plan: Ambulatory referral to Dermatology  Tinea versicolor - Plan: ketoconazole (NIZORAL) 2 % cream   Shown images of tinea versicolor and vitiligo.  Not consistent with vitiligo at this point (not hypopigmented enough, and very patchy/scattered loss of pigment, no full patch).  Refer and schedule appointment with Dr. Allyson Sabal for 3-4 weeks Cancel if better by then Expect pigmentation to take a couple of months to return to normal, but definitely see him if continuing to spread despite the treatment with antifungal x 2 weeks.

## 2014-09-27 NOTE — Patient Instructions (Signed)
Tinea Versicolor Tinea versicolor is a common yeast infection of the skin. This condition becomes known when the yeast on our skin starts to overgrow (yeast is a normal inhabitant on our skin). This condition is noticed as white or light brown patches on brown skin, and is more evident in the summer on tanned skin. These areas are slightly scaly if scratched. The light patches from the yeast become evident when the yeast creates "holes in your suntan". This is most often noticed in the summer. The patches are usually located on the chest, back, pubis, neck and body folds. However, it may occur on any area of body. Mild itching and inflammation (redness or soreness) may be present. DIAGNOSIS  The diagnosisof this is made clinically (by looking). Cultures from samples are usually not needed. Examination under the microscope may help. However, yeast is normally found on skin. The diagnosis still remains clinical. Examination under Wood's Ultraviolet Light can determine the extent of the infection. TREATMENT  This common infection is usually only of cosmetic (only a concern to your appearance). It is easily treated with dandruff shampoo used during showers or bathing. Vigorous scrubbing will eliminate the yeast over several days time. The light areas in your skin may remain for weeks or months after the infection is cured unless your skin is exposed to sunlight. The lighter or darker spots caused by the fungus that remain after complete treatment are not a sign of treatment failure; it will take a long time to resolve. Your caregiver may recommend a number of commercial preparations or medication by mouth if home care is not working. Recurrence is common and preventative medication may be necessary. This skin condition is not highly contagious. Special care is not needed to protect close friends and family members. Normal hygiene is usually enough. Follow up is required only if you develop complications (such as a  secondary infection from scratching), if recommended by your caregiver, or if no relief is obtained from the preparations used. Document Released: 02/28/2000 Document Revised: 05/25/2011 Document Reviewed: 04/11/2008 [facility] Patient Information 2015 [place], [place]. This information is not intended to replace advice given to you by your health care provider. Make sure you discuss any questions you have with your health care provider.   We are referring you to Dr. Allyson Sabal (for an appointment in 3-4 weeks).  Use the cream twice daily for 2 weeks.  Cancel the appointment if it has not spread and is starting to improve, but keep the appointment if no improvement and/or ongoing spread.

## 2014-11-05 LAB — HM DIABETES EYE EXAM

## 2014-11-17 ENCOUNTER — Emergency Department (INDEPENDENT_AMBULATORY_CARE_PROVIDER_SITE_OTHER)
Admission: EM | Admit: 2014-11-17 | Discharge: 2014-11-17 | Disposition: A | Discharge status: Home / Self Care | Payer: 59 | Source: Home / Self Care | Attending: Family Medicine | Admitting: Family Medicine

## 2014-11-17 ENCOUNTER — Encounter (HOSPITAL_COMMUNITY): Payer: Self-pay | Admitting: Emergency Medicine

## 2014-11-17 DIAGNOSIS — H8113 Benign paroxysmal vertigo, bilateral: Secondary | ICD-10-CM | POA: Diagnosis not present

## 2014-11-17 LAB — GLUCOSE, CAPILLARY: GLUCOSE-CAPILLARY: 124 mg/dL — AB (ref 65–99)

## 2014-11-17 MED ORDER — MECLIZINE HCL 50 MG PO TABS: 50.0000 mg | ORAL_TABLET | Freq: Two times a day (BID) | ORAL | Status: DC | PRN

## 2014-11-17 NOTE — ED Notes (Signed)
The patient presented to the [facility] with a complaint of feeling light headed and weakness in her legs that started 2 days ago. The patient stated that she feels more symptomatic first thing in the morning or when she is very active.

## 2014-11-17 NOTE — ED Provider Notes (Signed)
CSN: [id number]     Arrival date & time 11/17/14  1531 History   First MD Initiated Contact with Patient 11/17/14 1548     Chief Complaint  Patient presents with  . Dizziness  . Extremity Weakness   (Consider location/radiation/quality/duration/timing/severity/associated sxs/prior Treatment) HPI   Dizzy and lightheaded. Ongoing for 2 days. Constant. Associated w/ nausea. Sitting and eating while watching TV when came on.  Turning head makes it worse. No recent cold symptoms. Symptoms are not better or worse since onset. Has not tried anything for her symptoms.  ROS denies any unilateral weakness, change in speech, facial droop (patient with baseline facial paralysis from Bell's palsy), chest pain, shortness breath, palpitations, neck stiffness, headache,     Past Medical History  Diagnosis Date  . Bell's palsy 03/10/2011    diagnosed in [place]. ongoing R sided facial weakness   Past Surgical History  Procedure Laterality Date  . Total abdominal hysterectomy w/ bilateral salpingoophorectomy  1995  . Abdominal hysterectomy     Family History  Problem Relation Age of Onset  . Diabetes Mother   . Heart disease Father   . Arthritis Sister   . Diabetes Sister     prediabetes  . Sarcoidosis Brother   . Heart disease Brother 41  . Diabetes Sister     prediabetes  . Cancer Neg Hx    Social History  Substance Use Topics  . Smoking status: Current Every Day Smoker -- 0.50 packs/day for 41 years    Types: Cigarettes  . Smokeless tobacco: Never Used     Comment: quit x 1.5 years, restarted 06/2011  . Alcohol Use: 0.0 oz/week    0 Standard drinks or equivalent per week     Comment: 2 drinks per month, maybe.   OB History    Gravida Para Term Preterm AB TAB SAB Ectopic Multiple Living   2    2  2    0     Review of Systems Per HPI with all other pertinent systems negative.   Allergies  Review of patient's allergies indicates no known allergies.  Home Medications    Prior to Admission medications   Medication Sig Start Date End Date Taking? Authorizing Provider  meclizine (ANTIVERT) 50 MG tablet Take 1 tablet (50 mg total) by mouth 2 (two) times daily as needed for dizziness. 11/17/14   Waldemar Dickens, MD   Meds Ordered and Administered this Visit  Medications - No data to display  BP 130/76 mmHg  Temp(Src) 97.7 F (36.5 C) (Oral)  Resp 18  SpO2 95% Orthostatic VS for the past 24 hrs:  BP- Lying Pulse- Lying BP- Sitting Pulse- Sitting BP- Standing at 0 minutes Pulse- Standing at 0 minutes  11/17/14 1601 136/57 mmHg 73 141/55 mmHg 82 137/60 mmHg 88    Physical Exam Physical Exam  Constitutional: oriented to person, place, and time. appears well-developed and well-nourished. No distress.  HENT:  Head: Normocephalic and atraumatic.  Eyes: EOMI. PERRL.  Neck: Normal range of motion.  Cardiovascular: RRR, no m/r/g, 2+ distal pulses,  Pulmonary/Chest: Effort normal and breath sounds normal. No respiratory distress.  Abdominal: Soft. Bowel sounds are normal. NonTTP, no distension.  Musculoskeletal: Normal range of motion. Non ttp, no effusion.  Neurological: Paralysis to the left side of the face consistent with Bell's palsy. No dysmetria, was all extremities and coordinated fashion. A relation without difficulty. Horizontal nystagmus with head turning to the right. Skin: Skin is warm. No rash noted. non  diaphoretic.  Psychiatric: normal mood and affect. behavior is normal. Judgment and thought content normal.    ED Course  Procedures (including critical care time)  Labs Review Labs Reviewed  GLUCOSE, CAPILLARY - Abnormal; Notable for the following:    Glucose-Capillary 124 (*)    All other components within normal limits    Imaging Review No results found.   Visual Acuity Review  Right Eye Distance:   Left Eye Distance:   Bilateral Distance:    Right Eye Near:   Left Eye Near:    Bilateral Near:         MDM   1. BPV  (benign positional vertigo), bilateral    BPV. Start the please maneuvers and Antivert as needed. Generalized lower extremity weakness likely secondary to deconditioning as well as condition as above. No significant deficits. Orthostatics negative.   Waldemar Dickens, MD 11/17/14 [phone]

## 2014-11-17 NOTE — Discharge Instructions (Signed)
You may be suffering from a condition called BPV. This will require some time to resolve. You can speed up your recovery by performing Epley's maneuvers twice daily.  Use the meclizine for dizzy relief Please go to the emergency room if you are not better in another 1-2 days.     Epley Maneuver Self-Care WHAT IS THE EPLEY MANEUVER? The Epley maneuver is an exercise you can do to relieve symptoms of benign paroxysmal positional vertigo (BPPV). This condition is often just referred to as vertigo. BPPV is caused by the movement of tiny crystals (canaliths) inside your inner ear. The accumulation and movement of canaliths in your inner ear causes a sudden spinning sensation (vertigo) when you move your head to certain positions. Vertigo usually lasts about 30 seconds. BPPV usually occurs in just one ear. If you get vertigo when you lie on your left side, you probably have BPPV in your left ear. Your health care provider can tell you which ear is involved.  BPPV may be caused by a head injury. Many people older than 50 get BPPV for unknown reasons. If you have been diagnosed with BPPV, your health care provider may teach you how to do this maneuver. BPPV is not life threatening (benign) and usually goes away in time.  WHEN SHOULD I PERFORM THE EPLEY MANEUVER? You can do this maneuver at home whenever you have symptoms of vertigo. You may do the Epley maneuver up to 3 times a day until your symptoms of vertigo go away. HOW SHOULD I DO THE EPLEY MANEUVER? 1. Sit on the edge of a bed or table with your back straight. Your legs should be extended or hanging over the edge of the bed or table.  2. Turn your head halfway toward the affected ear.  3. Lie backward quickly with your head turned until you are lying flat on your back. You may want to position a pillow under your shoulders.  4. Hold this position for 30 seconds. You may experience an attack of vertigo. This is normal. Hold this position until the  vertigo stops. 5. Then turn your head to the opposite direction until your unaffected ear is facing the floor.  6. Hold this position for 30 seconds. You may experience an attack of vertigo. This is normal. Hold this position until the vertigo stops. 7. Now turn your whole body to the same side as your head. Hold for another 30 seconds.  8. You can then sit back up. ARE THERE RISKS TO THIS MANEUVER? In some cases, you may have other symptoms (such as changes in your vision, weakness, or numbness). If you have these symptoms, stop doing the maneuver and call your health care provider. Even if doing these maneuvers relieves your vertigo, you may still have dizziness. Dizziness is the sensation of light-headedness but without the sensation of movement. Even though the Epley maneuver may relieve your vertigo, it is possible that your symptoms will return within 5 years. WHAT SHOULD I DO AFTER THIS MANEUVER? After doing the Epley maneuver, you can return to your normal activities. Ask your doctor if there is anything you should do at home to prevent vertigo. This may include:  Sleeping with two or more pillows to keep your head elevated.  Not sleeping on the side of your affected ear.  Getting up slowly from bed.  Avoiding sudden movements during the day.  Avoiding extreme head movement, like looking up or bending over.  Wearing a cervical collar to prevent  sudden head movements. WHAT SHOULD I DO IF MY SYMPTOMS GET WORSE? Call your health care provider if your vertigo gets worse. Call your provider right way if you have other symptoms, including:   Nausea.  Vomiting.  Headache.  Weakness.  Numbness.  Vision changes. Document Released: 03/07/2013 Document Reviewed: 03/07/2013 [facility] Patient Information 2015 [place], [place]. This information is not intended to replace advice given to you by your health care provider. Make sure you discuss any questions you have with your health care  provider.

## 2014-12-03 ENCOUNTER — Ambulatory Visit (INDEPENDENT_AMBULATORY_CARE_PROVIDER_SITE_OTHER): Payer: 59 | Admitting: Family Medicine

## 2014-12-03 ENCOUNTER — Encounter: Payer: Self-pay | Admitting: Family Medicine

## 2014-12-03 VITALS — BP 130/60 | HR 74 | Temp 97.4°F | Resp 14 | Ht 61.0 in | Wt 160.4 lb

## 2014-12-03 DIAGNOSIS — G629 Polyneuropathy, unspecified: Secondary | ICD-10-CM | POA: Diagnosis not present

## 2014-12-03 MED ORDER — METHYLPREDNISOLONE 4 MG PO TBPK: ORAL_TABLET | ORAL | Status: DC

## 2014-12-03 NOTE — Patient Instructions (Signed)
I suspect that the tingling and pain is related to an inflamed nerve.  We will treat this inflammation with an oral steroid pill.  If you can't tolerate the medication, let me know and we can change it to the prescription naproxen that we discussed.  If your symptoms are not at all better after this treatment, the next step is a nerve conduction study, to help tell us where the problem is.  Let us know if you develop any new symptoms--weakness, spreading of the numbness and pain to other parts of the body, neck or upper arm pain.

## 2014-12-03 NOTE — Progress Notes (Signed)
Chief Complaint  Patient presents with  . arm and shoulder pain    for 2 weeks numbness goes from the elbow down. Ibuprofen helped some, not a lot. certains ways she holds her arm will make it go numb also. frequent heartburn. pt states all of this started when she got virtigo.   She was seen in UC on 9/3 with vertigo. Per ER visit, she had nystagmus when head as turned to the right, diagnosed with BPV.  She was told to take antivert as needed, and she was given maneuvers to do, and the BPV completely resolved.   The same time she started with the vertigo, she also noticed the problem with the right arm.  She gets numbness, and an ache along the medial aspect of the right forearm, and an ache into the hand, between the 3rd and 4th fingers.  She has tingling into the fingertips of 2nd through 5th fingers.  The tingling is all day, when at rest. She doesn't notice it as much when she is moving her arm throughout the day.  Worst in the evenings, when sleeping--it'll wake her up, and when she is sitting at rest (arm often on an armrest). She denies any neck pain or weakness into the arm or hands.  Grip strength is normal.  She has taken ibuprofen 400mg  TID for about 2 weeks.  She hasn't noticed any significant improvement in the pain or tingling. Denies any side effect or GI symptoms related to the NSAID.  Facial rash--she ended up seeing the dermatologist who prescribed the exact same medication (nizoral cream).  She reports it is slowly improving.  PMH, [place] SH reviewed  Outpatient Encounter Prescriptions as of 12/03/2014  Medication Sig  . [DISCONTINUED] meclizine (ANTIVERT) 50 MG tablet Take 1 tablet (50 mg total) by mouth 2 (two) times daily as needed for dizziness.   No facility-administered encounter medications on file as of 12/03/2014.   No Known Allergies  ROS:  No fever, chills, weakness, neck pain, headache, dizziness (vertigo resolved), chest pain, palpitations, nausea, vomiting,  abdominal pain, bleeding or rash.  PHYSICAL EXAM: BP 130/60 mmHg  Pulse 74  Temp(Src) 97.4 F (36.3 C) (Oral)  Resp 14  Ht 5\' 1"  (1.549 m)  Wt 160 lb 6.4 oz (72.757 kg)  BMI 30.32 kg/m2  Well developed, pleasant female in no distress Unchanged chronic facial weakness, residual Bell's palsy. No c-spine tenderness or muscle spasm in neck Normal strength in upper extremities Subjective difference in sensation (tingling/tender) at the webspace between the right 3rd and 4th fingers.  Fingers themselves have normal sensation as does the rest of the back of the hand--just small focal area between the MCP's that feels different. Negative Phalen's test, Tinel negative nontender at elbow and ulnar nerve. No swelling.  FROM of neck, elbow Subjective difference in sensation along the medial and anterior aspect of the right forearm.  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  Discussed risks/benefits and side effects of both NSAIDS and steroids in detail.  She prefers trial of the steroids.  If not improved, given lack of neck pain, next step would be EMG/NCV.  Declined flu shot

## 2014-12-06 ENCOUNTER — Encounter: Payer: Self-pay | Admitting: Family Medicine

## 2014-12-10 ENCOUNTER — Telehealth: Payer: Self-pay | Admitting: Family Medicine

## 2014-12-10 ENCOUNTER — Other Ambulatory Visit: Payer: Self-pay | Admitting: *Deleted

## 2014-12-10 DIAGNOSIS — R202 Paresthesia of skin: Secondary | ICD-10-CM

## 2014-12-10 NOTE — Telephone Encounter (Signed)
Done

## 2014-12-10 NOTE — Telephone Encounter (Signed)
Pt states she finished steroids and did help some but still having numbness and tingling and feels like muscle spasms in chest, feels like trapped gas, not chest pain.  Wants to know what she can do next?

## 2014-12-10 NOTE — Telephone Encounter (Signed)
Refer for EMG/NCV as stated in last note.  If she is having new symptoms, may need eval.  This test evaluates for the numbness and tingling that she described at her last visit.

## 2014-12-12 ENCOUNTER — Other Ambulatory Visit: Payer: Self-pay | Admitting: *Deleted

## 2014-12-12 DIAGNOSIS — R202 Paresthesia of skin: Secondary | ICD-10-CM

## 2014-12-17 ENCOUNTER — Other Ambulatory Visit: Payer: Self-pay | Admitting: *Deleted

## 2014-12-17 DIAGNOSIS — R200 Anesthesia of skin: Secondary | ICD-10-CM

## 2014-12-18 ENCOUNTER — Encounter: Payer: 59 | Admitting: Neurology

## 2014-12-25 ENCOUNTER — Ambulatory Visit (INDEPENDENT_AMBULATORY_CARE_PROVIDER_SITE_OTHER): Payer: 59 | Admitting: Neurology

## 2014-12-25 DIAGNOSIS — G5601 Carpal tunnel syndrome, right upper limb: Secondary | ICD-10-CM

## 2014-12-25 DIAGNOSIS — R200 Anesthesia of skin: Secondary | ICD-10-CM

## 2014-12-25 DIAGNOSIS — M5412 Radiculopathy, cervical region: Secondary | ICD-10-CM

## 2014-12-25 NOTE — Procedures (Signed)
[facility]  [place], [address]  [place], [place] [address] Tel: [phone] Fax:  [phone] Test Date:  12/25/2014  Patient: Summer Norris DOB: December 22, 1950 Physician: Narda Amber, DO  Sex: Female Height: 5\' 1"  Ref Phys: Rita Ohara, M.D.  ID#: [id number] Temp: 33.1C Technician: Jerilynn Mages. Dean   Patient Complaints: This is a 64 year old female presenting for evaluation of pain involing the dorsum of right hand that radiates into the elbow.  NCV & EMG Findings: Extensive electrodiagnostic testing of the right upper extremity and additional studies of the left shows:  1. Bilateral median and ulnar sensory responses are within normal limits. Palmar sensory responses on the right are abnormal. 2. Bilateral median and ulnar motor responses are within normal limits. 3. Chronic motor axon loss changes are seen affecting the right pronator teres, triceps, and extensor carpi radialis longus muscles, without accompanied active denervation. These findings are not present in the left upper extremity.  Impression: 1. 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 2. Right median neuropathy at or distal to the wrist, consistent with the clinical diagnosis of carpal tunnel syndrome. Overall, these findings are mild in degree electrically.   ___________________________ Narda Amber, DO    Nerve Conduction Studies Anti Sensory Summary Table   Site NR Peak (ms) Norm Peak (ms) P-T Amp (V) Norm P-T Amp  Left Median Anti Sensory (2nd Digit)  33.1C  Wrist    3.2 <3.8 35.1 >10  Right Median Anti Sensory (2nd Digit)  33.1C  Wrist    3.3 <3.8 36.6 >10  Left Ulnar Anti Sensory (5th Digit)  33.1C  Wrist    2.6 <3.2 22.4 >5  Right Ulnar Anti Sensory (5th Digit)  33.1C  Wrist    2.6 <3.2 21.8 >5   Motor Summary Table   Site NR Onset (ms) Norm Onset (ms) O-P Amp (mV) Norm O-P Amp Site1 Site2 Delta-0 (ms) Dist (cm) Vel (m/s) Norm Vel  (m/s)  Left Median Motor (Abd Poll Brev)  33.1C  Wrist    3.0 <4.0 6.1 >5 Elbow Wrist 3.7 22.0 59 >50  Elbow    6.7  5.9         Right Median Motor (Abd Poll Brev)  33.1C  Wrist    3.4 <4.0 7.5 >5 Elbow Wrist 4.2 23.0 55 >50  Elbow    7.6  7.5         Left Ulnar Motor (Abd Dig Minimi)  33.1C  Wrist    2.3 <3.1 10.3 >7 B Elbow Wrist 3.1 19.0 61 >50  B Elbow    5.4  9.7  A Elbow B Elbow 1.4 10.0 71 >50  A Elbow    6.8  9.7         Right Ulnar Motor (Abd Dig Minimi)  33.1C  Wrist    1.9 <3.1 7.5 >7 B Elbow Wrist 3.6 20.0 56 >50  B Elbow    5.5  7.2  A Elbow B Elbow 1.6 10.0 63 >50  A Elbow    7.1  7.2          Comparison Summary Table   Site NR Peak (ms) Norm Peak (ms) P-T Amp (V) Site1 Site2 Delta-P (ms) Norm Delta (ms)  Right Median/Ulnar Palm Comparison (Wrist - 8cm)  33.1C  Median Palm    2.3 <2.2 21.4 Median Palm Ulnar Palm 0.9   Ulnar Palm    1.4 <2.2 6.7  EMG   Side Muscle Ins Act Fibs Psw Fasc Number Recrt Dur Dur. Amp Amp. Poly Poly. Comment  Right 1stDorInt Nml Nml Nml Nml Nml Nml Nml Nml Nml Nml Nml Nml N/A  Right Abd Poll Brev Nml Nml Nml Nml Nml Nml Nml Nml Nml Nml Nml Nml N/A  Right Ext Indicis Nml Nml Nml Nml Nml Nml Nml Nml Nml Nml Nml Nml N/A  Right PronatorTeres Nml Nml Nml Nml 1- Rapid Some 1+ Few 1+ Nml Nml N/A  Right Biceps Nml Nml Nml Nml Nml Nml Nml Nml Nml Nml Nml Nml N/A  Right Triceps Nml Nml Nml Nml 1- Mod-R Some 1+ Some 1+ Nml Nml N/A  Right Deltoid Nml Nml Nml Nml Nml Nml Nml Nml Nml Nml Nml Nml N/A  Right ExtCarRadLong Nml Nml Nml Nml 1- Mod-R Few 1+ Few 1+ Nml Nml N/A  Left PronatorTeres Nml Nml Nml Nml Nml Nml Nml Nml Nml Nml Nml Nml N/A  Left Triceps Nml Nml Nml Nml Nml Nml Nml Nml Nml Nml Nml Nml N/A      Waveforms:

## 2015-01-10 ENCOUNTER — Encounter: Payer: Self-pay | Admitting: Family Medicine

## 2015-01-17 ENCOUNTER — Ambulatory Visit (INDEPENDENT_AMBULATORY_CARE_PROVIDER_SITE_OTHER): Payer: 59 | Admitting: Family Medicine

## 2015-01-17 ENCOUNTER — Encounter: Payer: Self-pay | Admitting: Family Medicine

## 2015-01-17 VITALS — BP 132/80 | HR 84 | Ht 61.0 in | Wt 157.6 lb

## 2015-01-17 DIAGNOSIS — E559 Vitamin D deficiency, unspecified: Secondary | ICD-10-CM | POA: Diagnosis not present

## 2015-01-17 DIAGNOSIS — R7303 Prediabetes: Secondary | ICD-10-CM

## 2015-01-17 DIAGNOSIS — G5601 Carpal tunnel syndrome, right upper limb: Secondary | ICD-10-CM

## 2015-01-17 DIAGNOSIS — F17200 Nicotine dependence, unspecified, uncomplicated: Secondary | ICD-10-CM | POA: Diagnosis not present

## 2015-01-17 DIAGNOSIS — M5010 Cervical disc disorder with radiculopathy, unspecified cervical region: Secondary | ICD-10-CM

## 2015-01-17 LAB — POCT GLYCOSYLATED HEMOGLOBIN (HGB A1C): Hemoglobin A1C: 5.7

## 2015-01-17 NOTE — Patient Instructions (Signed)
We will be in touch with your vitamin D results to let you know if you need additional prescription.  You will definitely need to get a higher strength of over-the-counter vitamin D (2000)--whether you start it right away, or after another prescription, we will let you know.  Return if you have worsening right hand/arm pain, definitely if you develop any weakness.   If you start having more symptoms of carpal tunnel syndrome (I think most of your symptoms are from your neck--your studies showed slight abnormalities in both regions), then try the wrist braces available at the pharmacy.  Carpal Tunnel Syndrome Carpal tunnel syndrome is a condition that causes pain in your hand and arm. The carpal tunnel is a narrow area located on the palm side of your wrist. Repeated wrist motion or certain diseases may cause swelling within the tunnel. This swelling pinches the main nerve in the wrist (median nerve). CAUSES  This condition may be caused by:   Repeated wrist motions.  Wrist injuries.  Arthritis.  A cyst or tumor in the carpal tunnel.  Fluid buildup during pregnancy. Sometimes the cause of this condition is not known.  RISK FACTORS This condition is more likely to develop in:   People who have jobs that cause them to repeatedly move their wrists in the same motion, such as butchers and cashiers.  Women.  People with certain conditions, such as:  Diabetes.  Obesity.  An underactive thyroid (hypothyroidism).  Kidney failure. SYMPTOMS  Symptoms of this condition include:   A tingling feeling in your fingers, especially in your thumb, index, and middle fingers.  Tingling or numbness in your hand.  An aching feeling in your entire arm, especially when your wrist and elbow are bent for long periods of time.  Wrist pain that goes up your arm to your shoulder.  Pain that goes down into your palm or fingers.  A weak feeling in your hands. You may have trouble grabbing and  holding items. Your symptoms may feel worse during the night.  DIAGNOSIS  This condition is diagnosed with a medical history and physical exam. You may also have tests, including:   An electromyogram (EMG). This test measures electrical signals sent by your nerves into the muscles.  X-rays. TREATMENT  Treatment for this condition includes:  Lifestyle changes. It is important to stop doing or modify the activity that caused your condition.  Physical or occupational therapy.  Medicines for pain and inflammation. This may include medicine that is injected into your wrist.  A wrist splint.  Surgery. HOME CARE INSTRUCTIONS  If You Have a Splint:  Wear it as told by your health care provider. Remove it only as told by your health care provider.  Loosen the splint if your fingers become numb and tingle, or if they turn cold and blue.  Keep the splint clean and dry. General Instructions  Take over-the-counter and prescription medicines only as told by your health care provider.  Rest your wrist from any activity that may be causing your pain. If your condition is work related, talk to your employer about changes that can be made, such as getting a wrist pad to use while typing.  If directed, apply ice to the painful area:  Put ice in a plastic bag.  Place a towel between your skin and the bag.  Leave the ice on for 20 minutes, 2-3 times per day.  Keep all follow-up visits as told by your health care provider. This is  important.  Do any exercises as told by your health care provider, physical therapist, or occupational therapist. [place] IF:   You have new symptoms.  Your pain is not controlled with medicines.  Your symptoms get worse.   This information is not intended to replace advice given to you by your health care provider. Make sure you discuss any questions you have with your health care provider.   Document Released: 02/28/2000 Document Revised:  11/21/2014 Document Reviewed: 07/18/2014 Elsevier Interactive Patient Education [employer].

## 2015-01-17 NOTE — Progress Notes (Signed)
Chief Complaint  Patient presents with  . Follow-up    go over NCV results as well as A1c and vit D recheck.   . Flu Vaccine    declined.    Vitamin D deficiency.  Level was down to 9 in April 2016. S/p prescription D, and now has been taking 1000 IU daily since completing the prescription strength.  She had been on the 1000 IU prior to that check, and had been advised to increase the dose to 2000 IU after finishing the prescription, but she didn't realize this, and didn't increase the dose. She continues to feel tired--it had gotten a little better with the prescription D, but is back to feeling tired  11/2014--treated with medrol dosepak for C7-8 radiculopathy (vs other cause). She had some improvement after the steroids, but has residual pain in her hand, at the area between the 3rd and 4th metacarpals. 2/10 discomfort, mostly a tingling. No weakness. The pain still goes up into the forearm, but not as often as before.  She had EMG/NCV studies showing: Impression: 1. Chronic C7 radiculopathy affecting the right upper extremity;  mild in degree electrically. 2. Right median neuropathy at or distal to the wrist, consistent  with the clinical diagnosis of carpal tunnel syndrome. Overall,  these findings are mild in degree electrically.   Pre-diabetes--She cut back on some sugar in her diet. She goes to the gym 2x/week.  Still smoking--no quit date set yet.  PMH, PSH, SH reviewed.  Outpatient Encounter Prescriptions as of 01/17/2015  Medication Sig  . cholecalciferol (VITAMIN D) 1000 UNITS tablet Take 1,000 Units by mouth daily.  . [DISCONTINUED] methylPREDNISolone (MEDROL DOSEPAK) 4 MG TBPK tablet Take as directed   No facility-administered encounter medications on file as of 01/17/2015.   No Known Allergies  ROS: no fever, chills, headaches, dizziness, chest pain, shortness of breath, URI symptoms. +fatigue and RUE symptoms as per HPI.  No bleeding, bruising, rash or other  concerns.  PHYSICAL EXAM: BP 132/80 mmHg  Pulse 84  Ht 5\' 1"  (1.549 m)  Wt 157 lb 9.6 oz (71.487 kg)  BMI 29.79 kg/m2  Well developed, pleasant female in no distress Neck: no lymphadenopathy or mass. No c-spine tenderness Heart: regular rate and rhythm Lungs: clear bilaterally Neuro: normal strength, sensation in UE's, normal gait.  Facial weakness per baseline, unchanged. Skin: normal turgor, no rashes or lesions Psych: normal mood, affect, hygiene and grooming Extremities: negative Tinel and Phalen. Normal strength, sensation. No edema  Lab Results  Component Value Date   HGBA1C 5.7 01/17/2015   This is down from 6.1 in April.  ASSESSMENT/PLAN:  Prediabetes - improved; encouraged weight loss, daily exercise continued low sugar diet - Plan: HgB A1c  Vitamin D deficiency - increase dose to 2000 IU (and add'l rx treatment based on today's lab results, if indicated) - Plan: Vit D  25 hydroxy (rtn osteoporosis monitoring), Vitamin D, Ergocalciferol, (DRISDOL) 50000 UNITS CAPS capsule  Tobacco use disorder - encouraged her to set a quit date  Cervical disc disorder with radiculopathy of cervical region - very mild symptoms, improved after oral steroids.--no further treatment needed now  Carpal tunnel syndrome of right wrist - asymptomatic at this time/improved (noted on EMG/NCV, improved symptoms after steroids)   Carpal tunnel and C7 radiculopathy RUE Mild at this time--doesn't desire treatment F/u if it gets worse, definitely if there is any weakness May need MRI (vs just another steroid course for flare of pain). Declines further eval at this  time.  Refuses flu shot

## 2015-01-18 DIAGNOSIS — G5600 Carpal tunnel syndrome, unspecified upper limb: Secondary | ICD-10-CM | POA: Insufficient documentation

## 2015-01-18 DIAGNOSIS — M5010 Cervical disc disorder with radiculopathy, unspecified cervical region: Secondary | ICD-10-CM | POA: Insufficient documentation

## 2015-01-18 LAB — VITAMIN D 25 HYDROXY (VIT D DEFICIENCY, FRACTURES): Vit D, 25-Hydroxy: 11 ng/mL — ABNORMAL LOW (ref 30–100)

## 2015-01-18 MED ORDER — VITAMIN D (ERGOCALCIFEROL) 1.25 MG (50000 UNIT) PO CAPS: 50000.0000 [IU] | ORAL_CAPSULE | ORAL | Status: DC

## 2015-01-21 ENCOUNTER — Other Ambulatory Visit: Payer: Self-pay | Admitting: *Deleted

## 2015-03-12 ENCOUNTER — Emergency Department (HOSPITAL_COMMUNITY)
Admission: EM | Admit: 2015-03-12 | Discharge: 2015-03-12 | Disposition: A | Discharge status: Home / Self Care | Payer: 59 | Attending: Emergency Medicine | Admitting: Emergency Medicine

## 2015-03-12 ENCOUNTER — Encounter (HOSPITAL_COMMUNITY): Payer: Self-pay | Admitting: Emergency Medicine

## 2015-03-12 DIAGNOSIS — F17210 Nicotine dependence, cigarettes, uncomplicated: Secondary | ICD-10-CM | POA: Diagnosis not present

## 2015-03-12 DIAGNOSIS — Z79899 Other long term (current) drug therapy: Secondary | ICD-10-CM | POA: Diagnosis not present

## 2015-03-12 DIAGNOSIS — K089 Disorder of teeth and supporting structures, unspecified: Secondary | ICD-10-CM | POA: Insufficient documentation

## 2015-03-12 DIAGNOSIS — K0889 Other specified disorders of teeth and supporting structures: Secondary | ICD-10-CM

## 2015-03-12 MED ORDER — PENICILLIN V POTASSIUM 500 MG PO TABS: 500.0000 mg | ORAL_TABLET | Freq: Four times a day (QID) | ORAL | Status: AC

## 2015-03-12 NOTE — ED Notes (Signed)
Patient states had tooth extracted on 02/18/15.   Patient states she was told by dentist she had "dry socket".   Patient states her dentist told her to come to ED, unsure why she is having pain.   Patient states her dentist told her to come get an xray.

## 2015-03-12 NOTE — ED Provider Notes (Signed)
CSN: [id number]     Arrival date & time 03/12/15  [id number] History   First MD Initiated Contact with Patient 03/12/15 0857     Chief Complaint  Patient presents with  . Dental Pain     (Consider location/radiation/quality/duration/timing/severity/associated sxs/prior Treatment) HPI   Summer Norris is a 64 y.o F who presents to the ED c/o dental pain. Pt states that she had her molar extracted on 12/5. She has continued to have pain in that area since then. She went back to see her dentist on 12/19 who told her that she had dry socket. However, the pain has continued. Pt states that she called her dentist today who told her to come to the ED to get an xray. Denies facial swelling, fever, odynophagia, ear pain, trismus, sore throat.   Past Medical History  Diagnosis Date  . Bell's palsy 03/10/2011    diagnosed in [place]. ongoing R sided facial weakness   Past Surgical History  Procedure Laterality Date  . Total abdominal hysterectomy w/ bilateral salpingoophorectomy  1995  . Abdominal hysterectomy    . Cataract extraction Bilateral 08/2014 and 09/2014    Dr. Herbert Deaner   Family History  Problem Relation Age of Onset  . Diabetes Mother   . Heart disease Father   . Arthritis Sister   . Diabetes Sister     prediabetes  . Sarcoidosis Brother   . Heart disease Brother 61  . Diabetes Sister     prediabetes  . Cancer Neg Hx    Social History  Substance Use Topics  . Smoking status: Current Every Day Smoker -- 0.50 packs/day for 41 years    Types: Cigarettes  . Smokeless tobacco: Never Used     Comment: quit x 1.5 years, restarted 06/2011  . Alcohol Use: 0.0 oz/week    0 Standard drinks or equivalent per week     Comment: 2 drinks per month, maybe.   OB History    Gravida Para Term Preterm AB TAB SAB Ectopic Multiple Living   2    2  2    0     Review of Systems  All other systems reviewed and are negative.     Allergies  Review of patient's allergies indicates no known  allergies.  Home Medications   Prior to Admission medications   Medication Sig Start Date End Date Taking? Authorizing Provider  cholecalciferol (VITAMIN D) 1000 UNITS tablet Take 1,000 Units by mouth daily.    Historical Provider, MD  penicillin v potassium (VEETID) 500 MG tablet Take 1 tablet (500 mg total) by mouth 4 (four) times daily. 03/12/15 03/19/15  Qualyn Oyervides Tripp Ayva Veilleux, PA-C  Vitamin D, Ergocalciferol, (DRISDOL) 50000 UNITS CAPS capsule Take 1 capsule (50,000 Units total) by mouth every 7 (seven) days. 01/18/15   Rita Ohara, MD   BP 167/70 mmHg  Pulse 64  Temp(Src) 98.4 F (36.9 C) (Oral)  Resp 20  SpO2 100% Physical Exam  Constitutional: She is oriented to person, place, and time. She appears well-developed and well-nourished. No distress.  HENT:  Head: Normocephalic and atraumatic.  Mouth/Throat: Oropharynx is clear and moist. No oropharyngeal exudate.  Pt with severe dental caries. Top R back molar has been removed. No fluctuant mass or erythema. No facial swelling. No swelling under tongue. Uvula midline. No edema. No TTP of mastoid.  Eyes: Conjunctivae are normal. Right eye exhibits no discharge. Left eye exhibits no discharge. No scleral icterus.  Neck: Neck supple.  No meningismus.  Cardiovascular:  Normal rate.   Pulmonary/Chest: Effort normal.  Lymphadenopathy:    She has no cervical adenopathy.  Neurological: She is alert and oriented to person, place, and time. Coordination normal.  R side facial drooping, chronic from Bell's palsy.  Skin: Skin is warm and dry. No rash noted. She is not diaphoretic. No erythema. No pallor.  Psychiatric: She has a normal mood and affect. Her behavior is normal.  Nursing note and vitals reviewed.   ED Course  Procedures (including critical care time) Labs Review Labs Reviewed - No data to display  Imaging Review No results found. I have personally reviewed and evaluated these images and lab results as part of my medical  decision-making.   EKG Interpretation None      MDM   Final diagnoses:  Pain, dental    Patient with dental pain.  No gross abscess.  Exam unconcerning for Ludwig's angina or spread of infection. Severe dental caries present. Will treat with penicillin. Pt has home pain medication that she can take. Encourage pt to follow up with her dentist today. No advanced imaging necessary.      Dondra Spry Roberts, PA-C 03/12/15 [place], MD 03/14/15 [phone]

## 2015-03-12 NOTE — Discharge Instructions (Signed)
Follow up with your dentist as soon as possible for re-evaluation. Take antibiotics as prescribed. Return to the ED if you experience severe increase in your pain, fever, facial swelling, difficulty swallowing.

## 2015-03-12 NOTE — ED Notes (Signed)
See PA assessment 

## 2015-05-06 ENCOUNTER — Other Ambulatory Visit: Payer: Self-pay

## 2015-05-06 DIAGNOSIS — Z1231 Encounter for screening mammogram for malignant neoplasm of breast: Secondary | ICD-10-CM

## 2015-05-20 ENCOUNTER — Ambulatory Visit
Admission: RE | Admit: 2015-05-20 | Discharge: 2015-05-20 | Disposition: A | Discharge status: Home / Self Care | Payer: BLUE CROSS/BLUE SHIELD | Source: Ambulatory Visit

## 2015-05-20 DIAGNOSIS — Z1231 Encounter for screening mammogram for malignant neoplasm of breast: Secondary | ICD-10-CM

## 2015-06-17 ENCOUNTER — Encounter: Payer: Self-pay | Admitting: Family Medicine

## 2015-07-15 ENCOUNTER — Encounter: Payer: 59 | Admitting: Family Medicine

## 2015-07-29 ENCOUNTER — Encounter: Payer: Self-pay | Admitting: Family Medicine

## 2015-07-31 ENCOUNTER — Encounter: Payer: Self-pay | Admitting: Family Medicine

## 2015-08-14 ENCOUNTER — Ambulatory Visit (INDEPENDENT_AMBULATORY_CARE_PROVIDER_SITE_OTHER): Payer: BLUE CROSS/BLUE SHIELD | Admitting: Family Medicine

## 2015-08-14 ENCOUNTER — Encounter: Payer: Self-pay | Admitting: Family Medicine

## 2015-08-14 VITALS — BP 130/66 | HR 80 | Ht 61.0 in | Wt 159.0 lb

## 2015-08-14 DIAGNOSIS — G5601 Carpal tunnel syndrome, right upper limb: Secondary | ICD-10-CM | POA: Diagnosis not present

## 2015-08-14 DIAGNOSIS — R5383 Other fatigue: Secondary | ICD-10-CM | POA: Diagnosis not present

## 2015-08-14 DIAGNOSIS — E559 Vitamin D deficiency, unspecified: Secondary | ICD-10-CM | POA: Diagnosis not present

## 2015-08-14 DIAGNOSIS — Z780 Asymptomatic menopausal state: Secondary | ICD-10-CM | POA: Diagnosis not present

## 2015-08-14 DIAGNOSIS — Z0000 Encounter for general adult medical examination without abnormal findings: Secondary | ICD-10-CM

## 2015-08-14 DIAGNOSIS — E669 Obesity, unspecified: Secondary | ICD-10-CM | POA: Diagnosis not present

## 2015-08-14 DIAGNOSIS — M5010 Cervical disc disorder with radiculopathy, unspecified cervical region: Secondary | ICD-10-CM

## 2015-08-14 DIAGNOSIS — Z1159 Encounter for screening for other viral diseases: Secondary | ICD-10-CM | POA: Diagnosis not present

## 2015-08-14 DIAGNOSIS — F17200 Nicotine dependence, unspecified, uncomplicated: Secondary | ICD-10-CM | POA: Diagnosis not present

## 2015-08-14 DIAGNOSIS — R7303 Prediabetes: Secondary | ICD-10-CM

## 2015-08-14 LAB — CBC WITH DIFFERENTIAL/PLATELET
BASOS ABS: 93 {cells}/uL (ref 0–200)
BASOS PCT: 1 %
EOS ABS: 279 {cells}/uL (ref 15–500)
Eosinophils Relative: 3 %
HCT: 47.6 % — ABNORMAL HIGH (ref 35.0–45.0)
HEMOGLOBIN: 16 g/dL — AB (ref 11.7–15.5)
LYMPHS ABS: 2325 {cells}/uL (ref 850–3900)
Lymphocytes Relative: 25 %
MCH: 29.4 pg (ref 27.0–33.0)
MCHC: 33.6 g/dL (ref 32.0–36.0)
MCV: 87.3 fL (ref 80.0–100.0)
MPV: 9.5 fL (ref 7.5–12.5)
Monocytes Absolute: 465 cells/uL (ref 200–950)
Monocytes Relative: 5 %
NEUTROS ABS: 6138 {cells}/uL (ref 1500–7800)
Neutrophils Relative %: 66 %
PLATELETS: 369 10*3/uL (ref 140–400)
RBC: 5.45 MIL/uL — ABNORMAL HIGH (ref 3.80–5.10)
RDW: 14.3 % (ref 11.0–15.0)
WBC: 9.3 10*3/uL (ref 4.0–10.5)

## 2015-08-14 LAB — COMPREHENSIVE METABOLIC PANEL
ALT: 16 U/L (ref 6–29)
AST: 18 U/L (ref 10–35)
Albumin: 4.4 g/dL (ref 3.6–5.1)
Alkaline Phosphatase: 108 U/L (ref 33–130)
BUN: 13 mg/dL (ref 7–25)
CALCIUM: 10 mg/dL (ref 8.6–10.4)
CO2: 25 mmol/L (ref 20–31)
Chloride: 103 mmol/L (ref 98–110)
Creat: 0.84 mg/dL (ref 0.50–0.99)
GLUCOSE: 98 mg/dL (ref 65–99)
POTASSIUM: 4.9 mmol/L (ref 3.5–5.3)
Sodium: 142 mmol/L (ref 135–146)
Total Bilirubin: 0.7 mg/dL (ref 0.2–1.2)
Total Protein: 7.5 g/dL (ref 6.1–8.1)

## 2015-08-14 LAB — POCT URINALYSIS DIPSTICK
Bilirubin, UA: NEGATIVE
Glucose, UA: NEGATIVE
KETONES UA: NEGATIVE
LEUKOCYTES UA: NEGATIVE
Nitrite, UA: NEGATIVE
PH UA: 5.5
UROBILINOGEN UA: NEGATIVE

## 2015-08-14 LAB — POCT GLYCOSYLATED HEMOGLOBIN (HGB A1C): HEMOGLOBIN A1C: 5.6

## 2015-08-14 LAB — TSH: TSH: 0.49 mIU/L

## 2015-08-14 NOTE — Patient Instructions (Addendum)
  HEALTH MAINTENANCE RECOMMENDATIONS:  It is recommended that you get at least 30 minutes of aerobic exercise at least 5 days/week (for weight loss, you may need as much as 60-90 minutes). This can be any activity that gets your heart rate up. This can be divided in 10-15 minute intervals if needed, but try and build up your endurance at least once a week.  Weight bearing exercise is also recommended twice weekly.  Eat a healthy diet with lots of vegetables, fruits and fiber.  "Colorful" foods have a lot of vitamins (ie green vegetables, tomatoes, red peppers, etc).  Limit sweet tea, regular sodas and alcoholic beverages, all of which has a lot of calories and sugar.  Up to 1 alcoholic drink daily may be beneficial for women (unless trying to lose weight, watch sugars).  Drink a lot of water.  Calcium recommendations are 1200-1500 mg daily (1500 mg for postmenopausal women or women without ovaries), and vitamin D 1000 IU daily.  This should be obtained from diet and/or supplements (vitamins), and calcium should not be taken all at once, but in divided doses.  Monthly self breast exams and yearly mammograms for women over the age of 39 is recommended.  Sunscreen of at least SPF 30 should be used on all sun-exposed parts of the skin when outside between the hours of 10 am and 4 pm (not just when at beach or pool, but even with exercise, golf, tennis, and yard work!)  Use a sunscreen that says "broad spectrum" so it covers both UVA and UVB rays, and make sure to reapply every 1-2 hours.  Remember to change the batteries in your smoke detectors when changing your clock times in the spring and fall.  Use your seat belt every time you are in a car, and please drive safely and not be distracted with cell phones and texting while driving.   Please try and quit smoking--start thinking about why/when you smoke (habit, boredom, stress) in order to come up with effective strategies to cut back or quit.  Available resources to help you quit include free counseling through [facility] ([place]Quitline.[facility] or 1-800-QUITNOW), smoking cessation classes through [facility] (call to find out schedule), over-the-counter nicotine replacements, and e-cigarettes (although this may not help break the hand-mouth habit). Many insurance companies also have smoking cessation programs (which may decrease the cost of patches, meds if enrolled). If these methods are not effective for you, and you are motivated to quit, return to discuss the possibility of prescription medications.  Check with your insurance to see if the shingles vaccine is covered. If it is, schedule a nurse visit to come back and get the vaccine. (just remember that you shouldn't have any vaccines within a month, before or after, this vaccine).  You should be getting a [facility] from [facility] to schedule your bone density test.  If they don't call, please call them and schedule. If they say your insurance won't cover, then we can wait until age 65 (I think it should).  Flu shots and pneumonia shot is recommended (flu shots yearly, in the fall).  Pneumonia vaccine is recommended for all smokers (now), and for everybody at age 63.  You have elected to defer that vaccine until age 65.  Let me know if you reconsider.

## 2015-08-14 NOTE — Progress Notes (Signed)
Chief Complaint  Patient presents with  . Annual Exam    fasting annual exam with pelvic. Did not do eye exam, said he had one this year already. No concerns.    Summer Norris is a 65 y.o. female who presents for a complete physical.  She has the following concerns:  Pre-diabetes--She cut back on some sugar in her diet. She goes to the gym 2x/week.  Tobacco Abuse: Still smoking 1/2 PPD--no quit date set yet.  She quit for a year in the past with Chantix.  She is still interested in trying to quit.  Vitamin D deficiency:  She is s/p prescription course last year, and again 6 months ago (level had dropped, had only been taking 1000 IU daily); she reports compliance with taking a supplement but only 1000 IU daily (didn't increase dose to 2000 IU as directed).  She does report some fatigue, leg heaviness, which has been typical for her when D was low in the past.  R carpal tunnel syndrome and C7 radiculopathy (s/p oral steroids in 11/2014 with good results).  Has some intermittent sharp pains in her neck, to the shoulder, which is relieved by OTC ibuprofen.  Denies numbness, tingling or weakness in the arm or hand.  Immunization History  Administered Date(s) Administered  . Tdap 10/19/2011   declines flu shots, pneumonia shot Last Pap smear: N/A, s/p hysterectomy (for scar tissue, benign) Last mammogram: 05/20/15 Last colonoscopy: Recalls having had one 2008, reportedly normal (she states it was [place], no record in computer) Last DEXA: she recalls having one--no record in chart. Believes it was normal (done at Dr. Sherlynn Stalls office) Dentist: seeing regularly, has f/u in 2 weeks. Ophtho:last went in December.  Had cataract surgery last year. Exercise: Walking/on her feet at work; lifts some heavy items at work (20-40#). Goes to the gym 2x/week--treadmill and core exercises.  Lipids: Lab Results  Component Value Date   CHOL 169 07/11/2014   HDL 35* 07/11/2014   LDLCALC 107* 07/11/2014    TRIG 133 07/11/2014   CHOLHDL 4.8 07/11/2014    Past Medical History  Diagnosis Date  . Bell's palsy 03/10/2011    diagnosed in [place]. ongoing R sided facial weakness  . Prediabetes   . C7 radiculopathy 2016    right  . Carpal tunnel syndrome 2016    right  . Smoker     Past Surgical History  Procedure Laterality Date  . Total abdominal hysterectomy w/ bilateral salpingoophorectomy  1995  . Abdominal hysterectomy    . Cataract extraction Bilateral 08/2014 and 09/2014    Dr. Herbert Deaner    Social History   Social History  . Marital Status: Legally Separated    Spouse Name: N/A  . Number of Children: N/A  . Years of Education: N/A   Occupational History  . parts Retail banker   Social History Main Topics  . Smoking status: Current Every Day Smoker -- 0.50 packs/day for 42 years    Types: Cigarettes  . Smokeless tobacco: Never Used     Comment: quit x 1.5 years, restarted 06/2011  . Alcohol Use: 0.0 oz/week    0 Standard drinks or equivalent per week     Comment: 2 drinks per month, maybe.  . Drug Use: No  . Sexual Activity: Not Currently    Birth Control/ Protection: Surgical   Other Topics Concern  . Not on file   Social History Narrative   Widowed and re-married, and then  divorced. No children. Lives alone    Family History  Problem Relation Age of Onset  . Diabetes Mother   . Heart disease Father   . Arthritis Sister   . Diabetes Sister     prediabetes  . Sarcoidosis Brother   . Heart disease Brother 10  . Lymphoma Brother 14  . Cancer Brother 23    lymphoma  . Diabetes Sister     prediabetes    Outpatient Encounter Prescriptions as of 08/14/2015  Medication Sig  . cholecalciferol (VITAMIN D) 1000 UNITS tablet Take 1,000 Units by mouth daily.  . [DISCONTINUED] Vitamin D, Ergocalciferol, (DRISDOL) 50000 UNITS CAPS capsule Take 1 capsule (50,000 Units total) by mouth every 7 (seven) days.   No facility-administered encounter  medications on file as of 08/14/2015.    No Known Allergies  ROS: The patient denies anorexia, fever, weight changes, headaches, decreased hearing, ear pain, sore throat, breast concerns, chest pain, palpitations, dizziness, syncope, dyspnea on exertion, cough, swelling, nausea, vomiting, diarrhea, constipation, abdominal pain, melena, hematochezia, hematuria, dysuria, vaginal bleeding, discharge, odor or itch, genital lesions, joint pains (intermittent neck and right shoulder as per HPI), numbness, tingling, weakness, tremor, suspicious skin lesions, depression, anxiety, abnormal bleeding/bruising, or enlarged lymph nodes. Heartburn is much improved since making dietary changes (after having tomato-based sauces only). Slight urinary leakage with cough    PHYSICAL EXAM:  BP 142/80 mmHg  Pulse 80  Ht '5\' 1"'  (1.549 m)  Wt 159 lb (72.122 kg)  BMI 30.06 kg/m2 130/66 on repeat by MD  General Appearance:   Alert, cooperative, no distress, appears stated age  Head:   Normocephalic, without obvious abnormality, atraumatic  Eyes:   PERRL, conjunctiva/corneas clear, EOM's intact; fundi benign  Ears:   R TM obscured by cerumen (deep--uses Qtips); L TM and EAC normal  Nose:  Nares normal, mucosa normal, no drainage or sinus tenderness  Throat:  Lips, mucosa, and tongue normal; Tooth decay noted; tooth extracted from right upper.  Gums normal  Neck:  Supple, no lymphadenopathy; thyroid: no enlargement/tenderness/nodules; no carotid  bruit or JVD  Back:  Spine nontender, no curvature, ROM normal, no CVA tenderness  Lungs:   Clear to auscultation bilaterally without wheezes, rales or ronchi; respirations unlabored  Chest Wall:   No tenderness or deformity  Heart:   Regular rate and rhythm, S1 and S2 normal, no murmur, rub  or gallop  Breast Exam:   No tenderness, masses, or nipple discharge or inversion. No axillary lymphadenopathy   Abdomen:   Soft, non-tender, nondistended, normoactive bowel sounds,   no masses, no hepatosplenomegaly. WHSS  Genitalia:   Normal external genitalia without lesions. BUS and vagina normal; uterus surgically absent. No abnormal vaginal discharge. No adnexal masses. Pap not performed  Rectal:   Normal tone, no masses or tenderness; guaiac negative stool  Extremities:  No clubbing, cyanosis or edema  Pulses:  2+ and symmetric all extremities  Skin:  Skin color, texture, turgor normal, no rashes or lesions  Lymph nodes:  Cervical, supraclavicular, and axillary nodes normal  Neurologic:  CNII-XII intact except some facial assymmetry due to chronic weakness from prior Bell's palsy; normal strength, sensation and gait; reflexes 2+ and symmetric throughout   Psych: Normal mood, affect, hygiene and grooming.       Lab Results  Component Value Date   HGBA1C 5.6 08/14/2015    ASSESSMENT/PLAN:  Annual physical exam - Plan: POCT Urinalysis Dipstick  Prediabetes - improved.  Continue exercise, weight loss, appropriate diet -  Plan: HgB A1c, Comprehensive metabolic panel  Vitamin D deficiency - suspect this may be low, due to not increasing dose to 2000 IU as recommended - Plan: VITAMIN D 25 Hydroxy (Vit-D Deficiency, Fractures), DG Bone Density  Cervical disc disorder with radiculopathy of cervical region - intermittent mild pain; overall significantly improved  Carpal tunnel syndrome of right wrist - no recent symptoms  Tobacco use disorder - counseled re: ways to quit in detail; encouraged to set quit date (ie July 4th) - Plan: DG Bone Density  Obesity (BMI 30.0-34.9) - weight loss encouraged  Other fatigue - Plan: Comprehensive metabolic panel, CBC with Differential/Platelet, VITAMIN D 25 Hydroxy (Vit-D Deficiency, Fractures), TSH  Postmenopausal estrogen deficiency - Plan: DG Bone Density  Need for hepatitis C  screening test - Plan: Hepatitis C antibody   Discussed monthly self breast exams and yearly mammograms; at least 30 minutes of aerobic activity at least 5 days/week, weight-bearing exercise at least 2x/wk; proper sunscreen use reviewed; healthy diet, including goals of calcium and vitamin D intake and alcohol recommendations (less than or equal to 1 drink/day) reviewed; regular seatbelt use; changing batteries in smoke detectors. Immunization recommendations discussed, see below. Colonoscopy recommendations reviewed, UTD per pt (due again 2018). Hemassure kit given.  DEXA recommended, since has risk factors (smoking and vitamin D deficiency). If not covered by insurance, wait until age 49.  Yearly flu shots recommended (declined by patient). Pneumonia vaccines recommended due to smoking. She declines. Discussed that if she quits, then can wait until age 8 for pneumonia vaccines. Shingles vaccine is recommended. Risks/side effects discussed--to check with her insurance and return for nurse visit   c-met, Vit D, TSH, CBC, Hepatitis C Ab today.  F/u 1 year for CPE (sooner prn based on lab results)

## 2015-08-15 ENCOUNTER — Telehealth: Payer: Self-pay

## 2015-08-15 LAB — VITAMIN D 25 HYDROXY (VIT D DEFICIENCY, FRACTURES): VIT D 25 HYDROXY: 12 ng/mL — AB (ref 30–100)

## 2015-08-15 LAB — HEPATITIS C ANTIBODY: HCV AB: NEGATIVE

## 2015-08-15 NOTE — Telephone Encounter (Signed)
Pt returning your call in regards to lab results.

## 2015-08-16 MED ORDER — VITAMIN D (ERGOCALCIFEROL) 1.25 MG (50000 UNIT) PO CAPS: 50000.0000 [IU] | ORAL_CAPSULE | ORAL | Status: DC

## 2015-08-16 NOTE — Telephone Encounter (Signed)
Pt at pharmacy and did not get rx vit D called into pharmacy. Vit D 50000 units (per lab annotations) called to pharmacy. Victor[place] December

## 2015-08-19 ENCOUNTER — Other Ambulatory Visit: Payer: BLUE CROSS/BLUE SHIELD

## 2015-08-19 DIAGNOSIS — Z0000 Encounter for general adult medical examination without abnormal findings: Secondary | ICD-10-CM

## 2015-08-20 ENCOUNTER — Encounter: Payer: Self-pay | Admitting: Family Medicine

## 2015-08-20 LAB — FECAL OCCULT BLOOD, IMMUNOCHEMICAL: FECAL OCCULT BLOOD: NEGATIVE

## 2015-09-05 ENCOUNTER — Ambulatory Visit
Admission: RE | Admit: 2015-09-05 | Discharge: 2015-09-05 | Disposition: A | Discharge status: Home / Self Care | Payer: BLUE CROSS/BLUE SHIELD | Source: Ambulatory Visit | Attending: Family Medicine | Admitting: Family Medicine

## 2015-09-05 DIAGNOSIS — Z780 Asymptomatic menopausal state: Secondary | ICD-10-CM

## 2015-09-05 DIAGNOSIS — E559 Vitamin D deficiency, unspecified: Secondary | ICD-10-CM

## 2015-09-05 DIAGNOSIS — F17200 Nicotine dependence, unspecified, uncomplicated: Secondary | ICD-10-CM

## 2015-09-06 ENCOUNTER — Telehealth: Payer: Self-pay

## 2015-09-09 ENCOUNTER — Ambulatory Visit (INDEPENDENT_AMBULATORY_CARE_PROVIDER_SITE_OTHER): Payer: BLUE CROSS/BLUE SHIELD | Admitting: Family Medicine

## 2015-09-09 ENCOUNTER — Encounter: Payer: Self-pay | Admitting: Family Medicine

## 2015-09-09 VITALS — BP 136/80 | HR 80 | Temp 97.7°F | Ht 61.0 in | Wt 160.6 lb

## 2015-09-09 DIAGNOSIS — R05 Cough: Secondary | ICD-10-CM

## 2015-09-09 DIAGNOSIS — E559 Vitamin D deficiency, unspecified: Secondary | ICD-10-CM | POA: Diagnosis not present

## 2015-09-09 DIAGNOSIS — F17200 Nicotine dependence, unspecified, uncomplicated: Secondary | ICD-10-CM | POA: Diagnosis not present

## 2015-09-09 DIAGNOSIS — M810 Age-related osteoporosis without current pathological fracture: Secondary | ICD-10-CM | POA: Diagnosis not present

## 2015-09-09 DIAGNOSIS — R059 Cough, unspecified: Secondary | ICD-10-CM

## 2015-09-09 DIAGNOSIS — J069 Acute upper respiratory infection, unspecified: Secondary | ICD-10-CM

## 2015-09-09 MED ORDER — ALENDRONATE SODIUM 70 MG PO TABS: 70.0000 mg | ORAL_TABLET | ORAL | Status: DC

## 2015-09-09 NOTE — Progress Notes (Signed)
Chief Complaint  Patient presents with  . Follow-up    discuss bone density results.   . Cough    lots of congestion that started last Monday, mucus is clear. No fevers. Some nasal pressure/congestion.    Last week she was out in the rain, got very wet.  She developed some tightness in her chest ,and has been coughing now for about 4-5 days.  She feels some pressure on the right nose/cheek.  Nasal mucus is clear.  Phlegm is also clear.  Denies fevers, chills, shortness of breath.  Denies chest pain, but sometimes is a little sore from coughing, sometimes sore like a gas build-up.  Unable to belch--feels like she needs to. She has occasionally heartburn, related only to certain foods. She continues to smoke 1/2 PPD.  She is also here to discuss her bone density results, revealing osteoporosis with T-3.0 at R fem neck.  PMH, PSH, SH reviewed.  Meds: Vitamin D 50,000 IU weekly No Known Allergies  ROS: no fever, chills, headache, dizziness, shortness of breath, chest pain (except as per HPI).  Occasional heartburn, no dysphagia. No bleeding, bruising, rash. URI symptoms as per HPI.  PHYSICAL EXAM: BP 136/80 mmHg  Pulse 80  Temp(Src) 97.7 F (36.5 C) (Tympanic)  Ht 5\' 1"  (1.549 m)  Wt 160 lb 9.6 oz (72.848 kg)  BMI 30.36 kg/m2 Well developed, well-appearing female in no distress HEENT: PERRL, EOMI, conjunctiva and sclera clear. TM's and EAC's are normal. Nasal mucosa shows moderate edema with white mucus on the right, clearish-white on the left.  OP is clear. Sinuses nontender Right facial weakness unchanged Neck: No lymphadenopathy or mass Heart: regular rate and rhythm Lungs: clear bilaterally, no wheezes, rales, ronchi Abdomen: soft, nontender. Extremities: no edema  ASSESSMENT/PLAN:   Osteoporosis - reviewed available treatments, risks/benefits, need for ongoing calcium, vitamin D and weight-bearing exercise, tobacco cessation - Plan: alendronate (FOSAMAX) 70 MG tablet  Acute  upper respiratory infection - supportive measures reviewed. no evidence of bacterial infection  Cough  Tobacco use disorder - encouraged cessation  Vitamin D deficiency - complete 12 week prescription course, then take 5000 IU Vitamin D daily. Re-check in December   Reviewed all risks/side effects to bisphosphonates, Prolia and Evista. Start with alendronate. Change to monthly bisphosphonate if needed based on side effects, vs changing class entirely.    Take the alendronate once a week. Take it with a full glass of water, and on an empty stomach. Do not eat or lay down for at least 30 minutes.  If you develop heartburn or reflux symptoms, you may use prilosec or zantac, and you can take this starting the night before taking the alendronate the following week, to try and prevent it from recurring.   If you continue to have problems with heartburn for a few days after taking the pill, it might make sense to switch to a monthly medication (taken just once a month). Or, we can consider the other medication options we discussed (Prolia injections twice a year, or the Evista tablet daily). If we end up switching to a once a month medication, you might want to check with your insurance to see if Boniva vs Actonel is preferred by your insurance.   Be sure to continue to get 1200-1500mg  of calcium daily from your diet and vitamins. Continue Vitamin D (remember to take 5000 IU daily after you finish the prescription course for the full 12 weeks). Be sure to get regular weight-bearing exercise and quit smoking,  as all of these things will help prevent further bone loss.  I recommend you taking Mucinex or Robitussin (guaifenesin--expectorant) to help keep the mucus thin--this should decrease chest congestion, thin out the mucus and help you feel better. You can also use decongestant such as sudafed as needed for any sinus pressure, vs using Claritin or zyrtec to help dry up the drainage  (antihistamines, usually used for allergies, rather than colds). Return or call if you develop discolored mucus or fevers.

## 2015-09-09 NOTE — Patient Instructions (Signed)
Take the alendronate once a week. Take it with a full glass of water, and on an empty stomach. Do not eat or lay down for at least 30 minutes.  If you develop heartburn or reflux symptoms, you may use prilosec or zantac, and you can take this starting the night before taking the alendronate the following week, to try and prevent it from recurring.   If you continue to have problems with heartburn for a few days after taking the pill, it might make sense to switch to a monthly medication (taken just once a month). Or, we can consider the other medication options we discussed (Prolia injections twice a year, or the Evista tablet daily). If we end up switching to a once a month medication, you might want to check with your insurance to see if Boniva vs Actonel is preferred by your insurance.   Be sure to continue to get 1200-1500mg  of calcium daily from your diet and vitamins. Continue Vitamin D (remember to take 5000 IU daily after you finish the prescription course for the full 12 weeks). Be sure to get regular weight-bearing exercise and quit smoking, as all of these things will help prevent further bone loss.  I recommend you taking Mucinex or Robitussin (guaifenesin--expectorant) to help keep the mucus thin--this should decrease chest congestion, thin out the mucus and help you feel better. You can also use decongestant such as sudafed as needed for any sinus pressure, vs using Claritin or zyrtec to help dry up the drainage (antihistamines, usually used for allergies, rather than colds). Return or call if you develop discolored mucus or fevers.  Osteoporosis Osteoporosis is the thinning and loss of density in the bones. Osteoporosis makes the bones more brittle, fragile, and likely to break (fracture). Over time, osteoporosis can cause the bones to become so weak that they fracture after a simple fall. The bones most likely to fracture are the bones in the hip, wrist, and spine. CAUSES  The  exact cause is not known. RISK FACTORS Anyone can develop osteoporosis. You may be at greater risk if you have a family history of the condition or have poor nutrition. You may also have a higher risk if you are:   Female.   63 years old or older.  A smoker.  Not physically active.   White or Asian.  Slender. SIGNS AND SYMPTOMS  A fracture might be the first sign of the disease, especially if it results from a fall or injury that would not usually cause a bone to break. Other signs and symptoms include:   Low back and neck pain.  Stooped posture.  Height loss. DIAGNOSIS  To make a diagnosis, your health care provider may:  Take a medical history.  Perform a physical exam.  Order tests, such as:  A bone mineral density test.  A dual-energy X-ray absorptiometry test. TREATMENT  The goal of osteoporosis treatment is to strengthen your bones to reduce your risk of a fracture. Treatment may involve:  Making lifestyle changes, such as:  Eating a diet rich in calcium.  Doing weight-bearing and muscle-strengthening exercises.  Stopping tobacco use.  Limiting alcohol intake.  Taking medicine to slow the process of bone loss or to increase bone density.  Monitoring your levels of calcium and vitamin D. HOME CARE INSTRUCTIONS  Include calcium and vitamin D in your diet. Calcium is important for bone health, and vitamin D helps the body absorb calcium.  Perform weight-bearing and muscle-strengthening exercises as directed  by your health care provider.  Do not use any tobacco products, including cigarettes, chewing tobacco, and electronic cigarettes. If you need help quitting, ask your health care provider.  Limit your alcohol intake.  Take medicines only as directed by your health care provider.  Keep all follow-up visits as directed by your health care provider. This is important.  Take precautions at home to lower your risk of falling, such as:  Keeping  rooms well lit and clutter free.  Installing safety rails on stairs.  Using rubber mats in the bathroom and other areas that are often wet or slippery. SEEK IMMEDIATE MEDICAL CARE IF:  You fall or injure yourself.    This information is not intended to replace advice given to you by your health care provider. Make sure you discuss any questions you have with your health care provider.   Document Released: 12/10/2004 Document Revised: 03/23/2014 Document Reviewed: 08/10/2013 Elsevier Interactive Patient Education [employer].

## 2015-09-12 NOTE — Progress Notes (Signed)
Left message for patient to please call back and schedule non fasting lab visit for first week of Dec for recheck of vitamin D.

## 2015-09-12 NOTE — Telephone Encounter (Signed)
error 

## 2016-02-19 ENCOUNTER — Other Ambulatory Visit (INDEPENDENT_AMBULATORY_CARE_PROVIDER_SITE_OTHER): Payer: BLUE CROSS/BLUE SHIELD

## 2016-02-19 DIAGNOSIS — E559 Vitamin D deficiency, unspecified: Secondary | ICD-10-CM

## 2016-02-20 LAB — VITAMIN D 25 HYDROXY (VIT D DEFICIENCY, FRACTURES): Vit D, 25-Hydroxy: 31 ng/mL (ref 30–100)

## 2016-04-28 ENCOUNTER — Encounter: Payer: Self-pay | Admitting: Medical

## 2016-04-28 ENCOUNTER — Ambulatory Visit (INDEPENDENT_AMBULATORY_CARE_PROVIDER_SITE_OTHER): Payer: BLUE CROSS/BLUE SHIELD | Admitting: Medical

## 2016-04-28 VITALS — BP 130/72 | HR 72 | Wt 165.0 lb

## 2016-04-28 DIAGNOSIS — G5601 Carpal tunnel syndrome, right upper limb: Secondary | ICD-10-CM

## 2016-04-28 DIAGNOSIS — M5412 Radiculopathy, cervical region: Secondary | ICD-10-CM | POA: Insufficient documentation

## 2016-04-28 DIAGNOSIS — M542 Cervicalgia: Secondary | ICD-10-CM

## 2016-04-28 DIAGNOSIS — M6289 Other specified disorders of muscle: Secondary | ICD-10-CM | POA: Diagnosis not present

## 2016-04-28 MED ORDER — NAPROXEN 375 MG PO TABS: 375.0000 mg | ORAL_TABLET | Freq: Two times a day (BID) | ORAL | 0 refills | Status: DC

## 2016-04-28 MED ORDER — CYCLOBENZAPRINE HCL 10 MG PO TABS: ORAL_TABLET | ORAL | 0 refills | Status: DC

## 2016-04-28 NOTE — Patient Instructions (Signed)
Recommendations  Begin using reinforced wrist splints on right wrists at bedtime only  Begin Naprosyn 375mg  twice daily for pain and inflammation and carpal tunnel syndrome.  Use this for 1-2 weeks, then as needed  For the next few nights, try flexeril muscle relaxer, 1/2-1 tablet at bedtime.   Caution - this will cause drowsiness.  This is to relieve some of the muscle tightness  Begin doing a daily stretching routine  Get some daily exercise such as walking or swimming or bike  Consider massage therapy  Follow up with Dr. Tomi Bamberger in 3-4 weeks.  If the carpal tunnel or neck and back symptoms are worsening, then we will consider other steps  You can use heat for the back/neck symptoms   Massage Therapy:  Ernestene Mention [facility] [address] [place] [address] [place], [place] [address] [phone] [email or web address]     Carpal Tunnel Syndrome Introduction Carpal tunnel syndrome is a condition that causes pain in your hand and arm. The carpal tunnel is a narrow area that is on the palm side of your wrist. Repeated wrist motion or certain diseases may cause swelling in the tunnel. This swelling can pinch the main nerve in the wrist (median nerve). Follow these instructions at home: If you have a splint:  Wear it as told by your doctor. Remove it only as told by your doctor.  Loosen the splint if your fingers:  Become numb and tingle.  Turn blue and cold.  Keep the splint clean and dry. General instructions  Take over-the-counter and prescription medicines only as told by your doctor.  Rest your wrist from any activity that may be causing your pain. If needed, talk to your employer about changes that can be made in your work, such as getting a wrist pad to use while typing.  If directed, apply ice to the painful area:  Put ice in a plastic bag.  Place a towel between your skin and the bag.  Leave the ice on for 20 minutes, 2-3 times per day.  Keep all  follow-up visits as told by your doctor. This is important.  Do any exercises as told by your doctor, physical therapist, or occupational therapist. Contact a doctor if:  You have new symptoms.  Medicine does not help your pain.  Your symptoms get worse. This information is not intended to replace advice given to you by your health care provider. Make sure you discuss any questions you have with your health care provider. Document Released: 02/19/2011 Document Revised: 08/08/2015 Document Reviewed: 07/18/2014  2017 Elsevier      Cervical Radiculopathy Introduction Cervical radiculopathy means that a nerve in the neck is pinched or bruised. This can cause pain or loss of feeling (numbness) that runs from your neck to your arm and fingers. Follow these instructions at home: Managing pain  Take over-the-counter and prescription medicines only as told by your doctor.  If directed, put ice on the injured or painful area.  Put ice in a plastic bag.  Place a towel between your skin and the bag.  Leave the ice on for 20 minutes, 2-3 times per day.  If ice does not help, you can try using heat. Take a warm shower or warm bath, or use a heat pack as told by your doctor.  You may try a gentle neck and shoulder massage. Activity  Rest as needed. Follow instructions from your doctor about any activities to avoid.  Do exercises as told by your doctor  or physical therapist. General instructions  If you were given a soft collar, wear it as told by your doctor.  Use a flat pillow when you sleep.  Keep all follow-up visits as told by your doctor. This is important. Contact a doctor if:  Your condition does not improve with treatment. Get help right away if:  Your pain gets worse and is not controlled with medicine.  You lose feeling or feel weak in your hand, arm, face, or leg.  You have a fever.  You have a stiff neck.  You cannot control when you poop or pee (have  incontinence).  You have trouble with walking, balance, or talking. This information is not intended to replace advice given to you by your health care provider. Make sure you discuss any questions you have with your health care provider. Document Released: 02/19/2011 Document Revised: 08/08/2015 Document Reviewed: 04/26/2014  2017 Elsevier

## 2016-04-28 NOTE — Progress Notes (Signed)
Subjective: Chief Complaint  Patient presents with  . shoulder pain , tingle in rt arm    shoulder pain tingling in rt arm ,heartburn    Here for right arm and shoulder pain, x 1 week.  Tight across shoulder, hand tingles, arm hurts.   Has pain through the day and night.   No recent injury, trauma or fall.   Works as Education officer, community for [employer].   Has some neck pain.   Exercise - nothing  No daily stretching.  Has had similar problems in the past.     Had NCS 12/2014, mild CTS right, C7 radiculopathy.  No fever, no low back pain, no leg symptoms.  No other aggravating or relieving factors. No other complaint.   Past Medical History:  Diagnosis Date  . Bell's palsy 03/10/2011   diagnosed in [place]. ongoing R sided facial weakness  . C7 radiculopathy 2016   right  . Carpal tunnel syndrome 2016   right  . Prediabetes   . Smoker    Current Outpatient Prescriptions on File Prior to Visit  Medication Sig Dispense Refill  . alendronate (FOSAMAX) 70 MG tablet Take 1 tablet (70 mg total) by mouth every 7 (seven) days. Take with a full glass of water on an empty stomach. 4 tablet 11  . cholecalciferol (VITAMIN D) 1000 UNITS tablet Take 1,000 Units by mouth daily. Reported on 09/09/2015     No current facility-administered medications on file prior to visit.    Past Surgical History:  Procedure Laterality Date  . ABDOMINAL HYSTERECTOMY    . CATARACT EXTRACTION Bilateral 08/2014 and 09/2014   Dr. Herbert Deaner  . TOTAL ABDOMINAL HYSTERECTOMY W/ BILATERAL SALPINGOOPHORECTOMY  1995     ROS as in subjective   Objective: BP 130/72   Pulse 72   Wt 165 lb (74.8 kg)   SpO2 97%   BMI 31.18 kg/m   Gen: wd, wn, nad Skin unremarkable of neck, hands, arms Neck nontender, supply, no mass, no thyromegaly, seemingly normal ROM Back nontender, no obvious deformity MSK: wrist, hand fingers, arm nontender, no deformity, ROM WNL of bilat arms Arms normal strength, sensation, DTRs, but  +tinel's right, -phalens Pulses and cap refill normal   Assessment: Encounter Diagnoses  Name Primary?  . Carpal tunnel syndrome of right wrist Yes  . Muscle tightness   . Neck pain   . Cervical radiculopathy     Plan: Discussed her concerns, prior 2016 NCS.    Recommendations  Begin using reinforced wrist splints on right wrists at bedtime only  Begin Naprosyn 375mg  twice daily for pain and inflammation and carpal tunnel syndrome.  Use this for 1-2 weeks, then as needed  For the next few nights, try flexeril muscle relaxer, 1/2-1 tablet at bedtime.   Caution - this will cause drowsiness.  This is to relieve some of the muscle tightness  Begin doing a daily stretching routine  Get some daily exercise such as walking or swimming or bike  Consider massage therapy  Follow up with Dr. Tomi Bamberger in 3-4 weeks.  If the carpal tunnel or neck and back symptoms are worsening, then we will consider other steps  You can use heat for the back/neck symptoms  Gave handouts on the diagnoses.    Summer Norris was seen today for shoulder pain , tingle in rt arm.  Diagnoses and all orders for this visit:  Carpal tunnel syndrome of right wrist  Muscle tightness  Neck pain  Cervical radiculopathy  Other  orders -     cyclobenzaprine (FLEXERIL) 10 MG tablet; 1/2-1 tablet po QHS prn -     naproxen (NAPROSYN) 375 MG tablet; Take 1 tablet (375 mg total) by mouth 2 (two) times daily with a meal.

## 2016-08-11 ENCOUNTER — Other Ambulatory Visit: Payer: Self-pay | Admitting: Family Medicine

## 2016-08-11 DIAGNOSIS — M810 Age-related osteoporosis without current pathological fracture: Secondary | ICD-10-CM

## 2016-08-11 NOTE — Telephone Encounter (Signed)
Left message for pt to call me back.   Pt has an appt next week with Dr. Tomi Bamberger. Wondering if she needs this med now or can wait

## 2016-08-11 NOTE — Telephone Encounter (Signed)
Pt has an appt next Wednesday 6/6/ @ 8:30am that she is aware of but pt is out of her fosamax, is it ok to refill

## 2016-08-13 ENCOUNTER — Ambulatory Visit (INDEPENDENT_AMBULATORY_CARE_PROVIDER_SITE_OTHER): Payer: Self-pay | Admitting: Family Medicine

## 2016-08-13 ENCOUNTER — Encounter: Payer: Self-pay | Admitting: Family Medicine

## 2016-08-13 VITALS — BP 148/84 | HR 80 | Temp 97.2°F | Ht 61.0 in | Wt 171.4 lb

## 2016-08-13 DIAGNOSIS — E559 Vitamin D deficiency, unspecified: Secondary | ICD-10-CM

## 2016-08-13 DIAGNOSIS — E669 Obesity, unspecified: Secondary | ICD-10-CM

## 2016-08-13 DIAGNOSIS — J069 Acute upper respiratory infection, unspecified: Secondary | ICD-10-CM

## 2016-08-13 DIAGNOSIS — R635 Abnormal weight gain: Secondary | ICD-10-CM

## 2016-08-13 DIAGNOSIS — R42 Dizziness and giddiness: Secondary | ICD-10-CM

## 2016-08-13 DIAGNOSIS — M810 Age-related osteoporosis without current pathological fracture: Secondary | ICD-10-CM

## 2016-08-13 DIAGNOSIS — R030 Elevated blood-pressure reading, without diagnosis of hypertension: Secondary | ICD-10-CM

## 2016-08-13 MED ORDER — ALENDRONATE SODIUM 70 MG PO TABS: 70.0000 mg | ORAL_TABLET | ORAL | 3 refills | Status: DC

## 2016-08-13 NOTE — Patient Instructions (Addendum)
Drink plenty of fluids. Use guaifenesin which is an expectorant to loosen up the mucus and phlegm.  You can find this in Mucinex or Robitussin.  Get the plain kind, since you don't have a cough or other symptoms. Avoid anything that says "sinus" or decongestant as this will raise your blood pressure more.  If your cough gets a little worse, you can change to the DM version of guaifenesin or add in a separate Delsym syrup.  Consider using sinus rinses if you develop increasing sinus pressure or pain.  Call us if you develop fever, or if the cough is changing to producing a discolored mucus/phlegm.  (I expect your first mucus in the morning to be a little darker/discolored--let us know if you are coughing up yellow-green phlegm throughout the day).    Meclizine (found in bonine and other OTC medications at the pharmacy) will help with the vertigo.  This may make you a little sleepy, so start with the 12.5mg  dose, and use with caution when driving.  Let us know if your dizziness worsens (causing significant spinning sensation, nausea), and if really seems to be only related to certain positions.  There may be a different recommended treatment if this occurs.      DASH Eating Plan DASH stands for "Dietary Approaches to Stop Hypertension." The DASH eating plan is a healthy eating plan that has been shown to reduce high blood pressure (hypertension). It may also reduce your risk for type 2 diabetes, heart disease, and stroke. The DASH eating plan may also help with weight loss. What are tips for following this plan? General guidelines  Avoid eating more than 2,300 mg (milligrams) of salt (sodium) a day. If you have hypertension, you may need to reduce your sodium intake to 1,500 mg a day.  Limit alcohol intake to no more than 1 drink a day for nonpregnant women and 2 drinks a day for men. One drink equals 12 oz of beer, 5 oz of wine, or 1 oz of hard liquor.  Work with your health care provider  to maintain a healthy body weight or to lose weight. Ask what an ideal weight is for you.  Get at least 30 minutes of exercise that causes your heart to beat faster (aerobic exercise) most days of the week. Activities may include walking, swimming, or biking.  Work with your health care provider or diet and nutrition specialist (dietitian) to adjust your eating plan to your individual calorie needs. Reading food labels  Check food labels for the amount of sodium per serving. Choose foods with less than 5 percent of the Daily Value of sodium. Generally, foods with less than 300 mg of sodium per serving fit into this eating plan.  To find whole grains, look for the word "whole" as the first word in the ingredient list. Shopping  Buy products labeled as "low-sodium" or "no salt added."  Buy fresh foods. Avoid canned foods and premade or frozen meals. Cooking  Avoid adding salt when cooking. Use salt-free seasonings or herbs instead of table salt or sea salt. Check with your health care provider or pharmacist before using salt substitutes.  Do not fry foods. Cook foods using healthy methods such as baking, boiling, grilling, and broiling instead.  Cook with heart-healthy oils, such as olive, canola, soybean, or sunflower oil. Meal planning   Eat a balanced diet that includes: ? 5 or more servings of fruits and vegetables each day. At each meal, try to fill half of  your plate with fruits and vegetables. ? Up to 6-8 servings of whole grains each day. ? Less than 6 oz of lean meat, poultry, or fish each day. A 3-oz serving of meat is about the same size as a deck of cards. One egg equals 1 oz. ? 2 servings of low-fat dairy each day. ? A serving of nuts, seeds, or beans 5 times each week. ? Heart-healthy fats. Healthy fats called Omega-3 fatty acids are found in foods such as flaxseeds and coldwater fish, like sardines, salmon, and mackerel.  Limit how much you eat of the  following: ? Canned or prepackaged foods. ? Food that is high in trans fat, such as fried foods. ? Food that is high in saturated fat, such as fatty meat. ? Sweets, desserts, sugary drinks, and other foods with added sugar. ? Full-fat dairy products.  Do not salt foods before eating.  Try to eat at least 2 vegetarian meals each week.  Eat more home-cooked food and less restaurant, buffet, and fast food.  When eating at a restaurant, ask that your food be prepared with less salt or no salt, if possible. What foods are recommended? The items listed may not be a complete list. Talk with your dietitian about what dietary choices are best for you. Grains Whole-grain or whole-wheat bread. Whole-grain or whole-wheat pasta. Brown rice. [place]. Bulgur. Whole-grain and low-sodium cereals. Pita bread. Low-fat, low-sodium crackers. Whole-wheat flour tortillas. Vegetables Fresh or frozen vegetables (raw, steamed, roasted, or grilled). Low-sodium or reduced-sodium tomato and vegetable juice. Low-sodium or reduced-sodium tomato sauce and tomato paste. Low-sodium or reduced-sodium canned vegetables. Fruits All fresh, dried, or frozen fruit. Canned fruit in natural juice (without added sugar). Meat and other protein foods Skinless chicken or [place]. Ground chicken or [place]. Pork with fat trimmed off. Fish and seafood. Egg whites. Dried beans, peas, or lentils. Unsalted nuts, nut butters, and seeds. Unsalted canned beans. Lean cuts of beef with fat trimmed off. Low-sodium, lean deli meat. Dairy Low-fat (1%) or fat-free (skim) milk. Fat-free, low-fat, or reduced-fat cheeses. Nonfat, low-sodium ricotta or cottage cheese. Low-fat or nonfat yogurt. Low-fat, low-sodium cheese. Fats and oils Soft margarine without trans fats. Vegetable oil. Low-fat, reduced-fat, or light mayonnaise and salad dressings (reduced-sodium). Canola, safflower, olive, soybean, and sunflower oils. Avocado. Seasoning and other  foods Herbs. Spices. Seasoning mixes without salt. Unsalted popcorn and pretzels. Fat-free sweets. What foods are not recommended? The items listed may not be a complete list. Talk with your dietitian about what dietary choices are best for you. Grains Baked goods made with fat, such as croissants, muffins, or some breads. Dry pasta or rice meal packs. Vegetables Creamed or fried vegetables. Vegetables in a cheese sauce. Regular canned vegetables (not low-sodium or reduced-sodium). Regular canned tomato sauce and paste (not low-sodium or reduced-sodium). Regular tomato and vegetable juice (not low-sodium or reduced-sodium). Angie Fava. Olives. Fruits Canned fruit in a light or heavy syrup. Fried fruit. Fruit in cream or butter sauce. Meat and other protein foods Fatty cuts of meat. Ribs. Fried meat. Berniece Salines. Sausage. Bologna and other processed lunch meats. Salami. Fatback. Hotdogs. Bratwurst. Salted nuts and seeds. Canned beans with added salt. Canned or smoked fish. Whole eggs or egg yolks. Chicken or [place] with skin. Dairy Whole or 2% milk, cream, and half-and-half. Whole or full-fat cream cheese. Whole-fat or sweetened yogurt. Full-fat cheese. Nondairy creamers. Whipped toppings. Processed cheese and cheese spreads. Fats and oils Butter. Stick margarine. Lard. Shortening. Ghee. Bacon fat. Tropical oils, such  as coconut, palm kernel, or palm oil. Seasoning and other foods Salted popcorn and pretzels. Onion salt, garlic salt, seasoned salt, table salt, and sea salt. Worcestershire sauce. Tartar sauce. Barbecue sauce. Teriyaki sauce. Soy sauce, including reduced-sodium. Steak sauce. Canned and packaged gravies. Fish sauce. Oyster sauce. Cocktail sauce. Horseradish that you find on the shelf. Ketchup. Mustard. Meat flavorings and tenderizers. Bouillon cubes. Hot sauce and Tabasco sauce. Premade or packaged marinades. Premade or packaged taco seasonings. Relishes. Regular salad dressings. Where to find  more information:  National Heart, Lung, and [place]: https://wilson-eaton.com/  American Heart Association: www.heart.org Summary  The DASH eating plan is a healthy eating plan that has been shown to reduce high blood pressure (hypertension). It may also reduce your risk for type 2 diabetes, heart disease, and stroke.  With the DASH eating plan, you should limit salt (sodium) intake to 2,300 mg a day. If you have hypertension, you may need to reduce your sodium intake to 1,500 mg a day.  When on the DASH eating plan, aim to eat more fresh fruits and vegetables, whole grains, lean proteins, low-fat dairy, and heart-healthy fats.  Work with your health care provider or diet and nutrition specialist (dietitian) to adjust your eating plan to your individual calorie needs. This information is not intended to replace advice given to you by your health care provider. Make sure you discuss any questions you have with your health care provider. Document Released: 02/19/2011 Document Revised: 02/24/2016 Document Reviewed: 02/24/2016 Elsevier Interactive Patient Education  2017 [employer].

## 2016-08-13 NOTE — Progress Notes (Signed)
Chief Complaint  Patient presents with  . Cough    tightness in her chest, no coughing. Bringing up a little mucus-not too much. Some PND. Very stuffy nose-no discolored mucus. Lots of facial pain and pressure. No ear pain, no fevers. Feels lightheaded when she gets up she said from her sinuses being so stuffed esp in the mornings.   . Other    saw note about fosamax refill in chart-looks like [facility] may have not given her the correct number of refils as she is out-okay to refill or should she wait until next week?   4 days ago she started with some lightheadedness/quilibrium seems off, pressure in her sinuses near her nose.  She has PND, no significant cough.  Unable to expectorate the phlegm, chest feels a little tight.  No nasal drainage, just stuffiness.  Denies sore throat. Feels a little off/dizzy when she shakes her head. This worries her because of her driving.  Hasn't taken any OTC medications.   Denies any sick contacts. No h/o seasonal allergies  Quit smoking 12/2015  PMH, PSH, SH reviewed  Outpatient Encounter Prescriptions as of 08/13/2016  Medication Sig  . alendronate (FOSAMAX) 70 MG tablet Take 1 tablet (70 mg total) by mouth every 7 (seven) days. Take with a full glass of water on an empty stomach.  . cholecalciferol (VITAMIN D) 1000 UNITS tablet Take 1,000 Units by mouth daily. Reported on 09/09/2015  . [DISCONTINUED] alendronate (FOSAMAX) 70 MG tablet Take 1 tablet (70 mg total) by mouth every 7 (seven) days. Take with a full glass of water on an empty stomach.  . [DISCONTINUED] cyclobenzaprine (FLEXERIL) 10 MG tablet 1/2-1 tablet po QHS prn  . [DISCONTINUED] naproxen (NAPROSYN) 375 MG tablet Take 1 tablet (375 mg total) by mouth 2 (two) times daily with a meal.   No facility-administered encounter medications on file as of 08/13/2016.    No Known Allergies   ROS:  No fever, chills, nausea, vomiting or diarrhea, rashes, bleeding, bruising, joint or muscle aches. Weight gain  since quitting smoking. No chest pain, shortness of breath, headache.  PHYSICAL EXAM:  BP (!) 148/84 (BP Location: Right Arm, Patient Position: Supine, Cuff Size: Normal)   Pulse 80   Temp 97.2 F (36.2 C) (Tympanic)   Ht '5\' 1"'  (1.549 m)   Wt 171 lb 6.4 oz (77.7 kg)   BMI 32.39 kg/m   Wt Readings from Last 3 Encounters:  08/13/16 171 lb 6.4 oz (77.7 kg)  04/28/16 165 lb (74.8 kg)  09/09/15 160 lb 9.6 oz (72.8 kg)   Well developed, pleasant female in no distress HEENT: PERRL, EOMI, conjunctiva and sclera clear.  TM's and EAC's normal. Nose mod edema, L>R with clear mucus; sinuses nontender. OP is clear. Right facial weakness, unchanged Neck: no lymphadenopathy or mass Heart: regular rate and rhythm Lungs: clear bilaterally, fair air movement (slightly decreased).  No wheezes, rales, ronchi Extremities: no edema Skin: no visible rash Neuro: chronic right facial weakness.  Normal gait. Some increased in dizzy sensation (no nystagmus) when laying down with head turned to the left.  Did not dissipate, was only mild.  felt better sitting up with head to the left than laying. No associated nausea, just mild dizzy sensation.  ASSESSMENT/PLAN:  Viral upper respiratory illness - supportive measures reviewed, and s/sx infection  Vertigo - very mild, likely related to viral URI, minimal positional component.  meclizine prn  Osteoporosis without current pathological fracture, unspecified osteoporosis type - Plan: alendronate (FOSAMAX)  70 MG tablet  Congratulated on quitting smoking. Elevated BP today, no h/o hypertension.  Briefly discussed low sodium diet, exercise, weight loss, and checking BP at pharmacy.  Will recheck at upcoming CPE.  Return for CPE as scheduled, with fasting labs prior. Vit D, CBC, TSH,  c-met     Drink plenty of fluids. Use guaifenesin which is an expectorant to loosen up the mucus and phlegm.  You can find this in Mucinex or Robitussin.  Get the plain  kind, since you don't have a cough or other symptoms. Avoid anything that says "sinus" or decongestant as this will raise your blood pressure more.  If your cough gets a little worse, you can change to the DM version of guaifenesin or add in a separate Delsym syrup.  Consider using sinus rinses if you develop increasing sinus pressure or pain.  Call us if you develop fever, or if the cough is changing to producing a discolored mucus/phlegm.  (I expect your first mucus in the morning to be a little darker/discolored--let us know if you are coughing up yellow-green phlegm throughout the day).    Meclizine (found in bonine and other OTC medications at the pharmacy) will help with the vertigo.  This may make you a little sleepy, so start with the 12.24m dose, and use with caution when driving.  Let [place] if your dizziness worsens (causing significant spinning sensation, nausea), and if really seems to be only related to certain positions.  There may be a different recommended treatment if this occurs.

## 2016-08-19 ENCOUNTER — Encounter: Payer: BLUE CROSS/BLUE SHIELD | Admitting: Family Medicine

## 2016-10-30 ENCOUNTER — Encounter: Payer: Self-pay | Admitting: Family Medicine

## 2016-12-31 ENCOUNTER — Ambulatory Visit (INDEPENDENT_AMBULATORY_CARE_PROVIDER_SITE_OTHER): Payer: Self-pay | Admitting: Family Medicine

## 2016-12-31 ENCOUNTER — Encounter: Payer: Self-pay | Admitting: Family Medicine

## 2016-12-31 VITALS — BP 140/84 | HR 80 | Ht 61.0 in | Wt 165.4 lb

## 2016-12-31 DIAGNOSIS — Z789 Other specified health status: Secondary | ICD-10-CM

## 2016-12-31 DIAGNOSIS — R030 Elevated blood-pressure reading, without diagnosis of hypertension: Secondary | ICD-10-CM

## 2016-12-31 DIAGNOSIS — R809 Proteinuria, unspecified: Secondary | ICD-10-CM

## 2016-12-31 DIAGNOSIS — E669 Obesity, unspecified: Secondary | ICD-10-CM

## 2016-12-31 DIAGNOSIS — Z720 Tobacco use: Secondary | ICD-10-CM

## 2016-12-31 LAB — POCT URINALYSIS DIP (PROADVANTAGE DEVICE)
BILIRUBIN UA: NEGATIVE
Blood, UA: NEGATIVE
GLUCOSE UA: NEGATIVE mg/dL
Ketones, POC UA: NEGATIVE mg/dL
LEUKOCYTES UA: NEGATIVE
Nitrite, UA: POSITIVE — AB
PH UA: 6 (ref 5.0–8.0)
Protein Ur, POC: 30 mg/dL — AB
Specific Gravity, Urine: 1.025
UUROB: NEGATIVE

## 2016-12-31 NOTE — Progress Notes (Signed)
Chief Complaint  Patient presents with  . Advice Only    patient went for smoking study and they could not do it because they found protein in her urine. She brought the labs in with her today.    She stopped smoking a year ago.  She started again so that she could get extra money from participating in a smoking study.  She was supposed to be smoking 10/d to be part of the study, but it "tasted nasty", could only "get through 4".  She hasn't smoked since they told her she couldn't participate in the study, but is vaping. She vapes 2x/day.  Labs done prior to the study on 12/30/16 included c-met, CBC (notable for plt 399, otherwise completely normal).  Urinalysis showed +nitrite and 2+ protein.  Some mucus and few bacteria; 0-5 WBC, 0-10 epithelial cells, no RBC. HIV, HCV and HepBSAg were all negative  She returned for a repeat urine dipstick on 10/14 which showed 3+ protein, trace blood, neg nitrite.  Denies dysuria, urgency, frequency. Occasionally wakes up with a sharp pain on the right side of her stomach.  Knife-like/short-lived. Very sporadic, once a month, like a muscle spasm.    Denies vaginal discharge, odor, itch.  Has slight pressure only if she holds her urine for a long time.  She tries to limit sugar and sodium in her diet.  No canned foods. Not currently getting regular exercise. [facility] 140[facility].   PMH, PSH, SH reviewed  Outpatient Encounter Prescriptions as of 12/31/2016  Medication Sig  . alendronate (FOSAMAX) 70 MG tablet Take 1 tablet (70 mg total) by mouth every 7 (seven) days. Take with a full glass of water on an empty stomach.  . cholecalciferol (VITAMIN D) 1000 UNITS tablet Take 1,000 Units by mouth daily. Reported on 09/09/2015   No facility-administered encounter medications on file as of 12/31/2016.    No Known Allergies  ROS:  No fever, chills, URI symptoms, headaches, dizziness, chest pain, or other complaints except as  noted above.  PHYSICAL EXAM:  BP 140/84   Pulse 80   Ht '5\' 1"'  (1.549 m)   Wt 165 lb 6.4 oz (75 kg)   BMI 31.25 kg/m   BP Readings from Last 3 Encounters:  12/31/16 140/84  08/13/16 (!) 148/84  04/28/16 130/72   Wt Readings from Last 3 Encounters:  12/31/16 165 lb 6.4 oz (75 kg)  08/13/16 171 lb 6.4 oz (77.7 kg)  04/28/16 165 lb (74.8 kg)    140/68 on repeat by MD, RA  Well appearing, pleasant female, in no distress HEENT: conjunctiva and sclera are clear Neck: no lymphadenopathy or mass Heart: regular rate and rhythm Lungs: clear bilaterally Back: no CVA tenderness Abdomen: soft, nontender Extremities: 2+ pulses, no edema Psych: normal mood, affect, hygiene and grooming  U/a--cloudy urine, SG 1.025, 30 protein, positive nitrite Prior results reviewed--had 1+ protein on urine in 07/2015, trace protein in 06/2014.   ASSESSMENT/PLAN:  Proteinuria, unspecified type - Plan: POCT Urinalysis DIP (Proadvantage Device)  Borderline high blood pressure  Obesity (BMI 30.0-34.9)  Use of nicotine containing substance in combustion-free vaporization device - encouraged abstinence from smoking and vaping   Discussed Ddx of causes of proteinuria.  My concern is her elevated blood pressures.  Her Creatinine was normal. Encouraged low sodium diet, regular exercise, weight loss. She has had some elevated glu in past as well. She currently is having insurance problems, so holding off on checking  A1c (sugar was fine on labs at study) or 24 hr protein.  Instead, will recheck in 3 mos when hopefully BP's are better.  May need to start either ACEI/ARB vs HCTZ to get blood pressure to goal.  Please work on stopping vaping (and continue to refrain from smoking).  It is recommended that you get at least 30 minutes of aerobic exercise at least 5 days/week (for weight loss, you may need as much as 60-90 minutes). This can be any activity that gets your heart rate up. This can be divided in  10-15 minute intervals if needed, but try and build up your endurance at least once a week.  Weight bearing exercise is also recommended twice weekly.  Continue to follow low sodium diet.   Continue to check your blood pressure at the pharmacy (vs getting your own monitor).  Be sure to sit for a few minutes and recheck it if the first reading is 140/80 or higher. Record both values. Continue to limit sweets and work hard to lose some weight. This also helps with blood pressure and sugars. Return here in a month with your list of blood pressure, and bring the list of your blood pressures with you.  If you get a monitor, bring it to your visit.

## 2016-12-31 NOTE — Patient Instructions (Signed)
Please work on stopping vaping (and continue to refrain from smoking).  It is recommended that you get at least 30 minutes of aerobic exercise at least 5 days/week (for weight loss, you may need as much as 60-90 minutes). This can be any activity that gets your heart rate up. This can be divided in 10-15 minute intervals if needed, but try and build up your endurance at least once a week.  Weight bearing exercise is also recommended twice weekly.  Continue to follow low sodium diet.   Continue to check your blood pressure at the pharmacy (vs getting your own monitor).  Be sure to sit for a few minutes and recheck it if the first reading is 140/80 or higher. Record both values. Continue to limit sweets and work hard to lose some weight. This also helps with blood pressure and sugars. Return here in a month with your list of blood pressure, and bring the list of your blood pressures with you.  If you get a monitor, bring it to your visit.    DASH Eating Plan DASH stands for "Dietary Approaches to Stop Hypertension." The DASH eating plan is a healthy eating plan that has been shown to reduce high blood pressure (hypertension). It may also reduce your risk for type 2 diabetes, heart disease, and stroke. The DASH eating plan may also help with weight loss. What are tips for following this plan? General guidelines  Avoid eating more than 2,300 mg (milligrams) of salt (sodium) a day. If you have hypertension, you may need to reduce your sodium intake to 1,500 mg a day.  Limit alcohol intake to no more than 1 drink a day for nonpregnant women and 2 drinks a day for men. One drink equals 12 oz of beer, 5 oz of wine, or 1 oz of hard liquor.  Work with your health care provider to maintain a healthy body weight or to lose weight. Ask what an ideal weight is for you.  Get at least 30 minutes of exercise that causes your heart to beat faster (aerobic exercise) most days of the week. Activities may  include walking, swimming, or biking.  Work with your health care provider or diet and nutrition specialist (dietitian) to adjust your eating plan to your individual calorie needs. Reading food labels  Check food labels for the amount of sodium per serving. Choose foods with less than 5 percent of the Daily Value of sodium. Generally, foods with less than 300 mg of sodium per serving fit into this eating plan.  To find whole grains, look for the word "whole" as the first word in the ingredient list. Shopping  Buy products labeled as "low-sodium" or "no salt added."  Buy fresh foods. Avoid canned foods and premade or frozen meals. Cooking  Avoid adding salt when cooking. Use salt-free seasonings or herbs instead of table salt or sea salt. Check with your health care provider or pharmacist before using salt substitutes.  Do not fry foods. Cook foods using healthy methods such as baking, boiling, grilling, and broiling instead.  Cook with heart-healthy oils, such as olive, canola, soybean, or sunflower oil. Meal planning   Eat a balanced diet that includes: ? 5 or more servings of fruits and vegetables each day. At each meal, try to fill half of your plate with fruits and vegetables. ? Up to 6-8 servings of whole grains each day. ? Less than 6 oz of lean meat, poultry, or fish each day. A 3-oz serving of  meat is about the same size as a deck of cards. One egg equals 1 oz. ? 2 servings of low-fat dairy each day. ? A serving of nuts, seeds, or beans 5 times each week. ? Heart-healthy fats. Healthy fats called Omega-3 fatty acids are found in foods such as flaxseeds and coldwater fish, like sardines, salmon, and mackerel.  Limit how much you eat of the following: ? Canned or prepackaged foods. ? Food that is high in trans fat, such as fried foods. ? Food that is high in saturated fat, such as fatty meat. ? Sweets, desserts, sugary drinks, and other foods with added sugar. ? Full-fat  dairy products.  Do not salt foods before eating.  Try to eat at least 2 vegetarian meals each week.  Eat more home-cooked food and less restaurant, buffet, and fast food.  When eating at a restaurant, ask that your food be prepared with less salt or no salt, if possible. What foods are recommended? The items listed may not be a complete list. Talk with your dietitian about what dietary choices are best for you. Grains Whole-grain or whole-wheat bread. Whole-grain or whole-wheat pasta. Brown rice. [place]. Bulgur. Whole-grain and low-sodium cereals. Pita bread. Low-fat, low-sodium crackers. Whole-wheat flour tortillas. Vegetables Fresh or frozen vegetables (raw, steamed, roasted, or grilled). Low-sodium or reduced-sodium tomato and vegetable juice. Low-sodium or reduced-sodium tomato sauce and tomato paste. Low-sodium or reduced-sodium canned vegetables. Fruits All fresh, dried, or frozen fruit. Canned fruit in natural juice (without added sugar). Meat and other protein foods Skinless chicken or [place]. Ground chicken or [place]. Pork with fat trimmed off. Fish and seafood. Egg whites. Dried beans, peas, or lentils. Unsalted nuts, nut butters, and seeds. Unsalted canned beans. Lean cuts of beef with fat trimmed off. Low-sodium, lean deli meat. Dairy Low-fat (1%) or fat-free (skim) milk. Fat-free, low-fat, or reduced-fat cheeses. Nonfat, low-sodium ricotta or cottage cheese. Low-fat or nonfat yogurt. Low-fat, low-sodium cheese. Fats and oils Soft margarine without trans fats. Vegetable oil. Low-fat, reduced-fat, or light mayonnaise and salad dressings (reduced-sodium). Canola, safflower, olive, soybean, and sunflower oils. Avocado. Seasoning and other foods Herbs. Spices. Seasoning mixes without salt. Unsalted popcorn and pretzels. Fat-free sweets. What foods are not recommended? The items listed may not be a complete list. Talk with your dietitian about what dietary choices are best  for you. Grains Baked goods made with fat, such as croissants, muffins, or some breads. Dry pasta or rice meal packs. Vegetables Creamed or fried vegetables. Vegetables in a cheese sauce. Regular canned vegetables (not low-sodium or reduced-sodium). Regular canned tomato sauce and paste (not low-sodium or reduced-sodium). Regular tomato and vegetable juice (not low-sodium or reduced-sodium). Angie Fava. Olives. Fruits Canned fruit in a light or heavy syrup. Fried fruit. Fruit in cream or butter sauce. Meat and other protein foods Fatty cuts of meat. Ribs. Fried meat. Berniece Salines. Sausage. Bologna and other processed lunch meats. Salami. Fatback. Hotdogs. Bratwurst. Salted nuts and seeds. Canned beans with added salt. Canned or smoked fish. Whole eggs or egg yolks. Chicken or [place] with skin. Dairy Whole or 2% milk, cream, and half-and-half. Whole or full-fat cream cheese. Whole-fat or sweetened yogurt. Full-fat cheese. Nondairy creamers. Whipped toppings. Processed cheese and cheese spreads. Fats and oils Butter. Stick margarine. Lard. Shortening. Ghee. Bacon fat. Tropical oils, such as coconut, palm kernel, or palm oil. Seasoning and other foods Salted popcorn and pretzels. Onion salt, garlic salt, seasoned salt, table salt, and sea salt. Worcestershire sauce. Tartar sauce. Barbecue sauce. Teriyaki  sauce. Soy sauce, including reduced-sodium. Steak sauce. Canned and packaged gravies. Fish sauce. Oyster sauce. Cocktail sauce. Horseradish that you find on the shelf. Ketchup. Mustard. Meat flavorings and tenderizers. Bouillon cubes. Hot sauce and Tabasco sauce. Premade or packaged marinades. Premade or packaged taco seasonings. Relishes. Regular salad dressings. Where to find more information:  National Heart, Lung, and [place]: https://wilson-eaton.com/  American Heart Association: www.heart.org Summary  The DASH eating plan is a healthy eating plan that has been shown to reduce high blood pressure  (hypertension). It may also reduce your risk for type 2 diabetes, heart disease, and stroke.  With the DASH eating plan, you should limit salt (sodium) intake to 2,300 mg a day. If you have hypertension, you may need to reduce your sodium intake to 1,500 mg a day.  When on the DASH eating plan, aim to eat more fresh fruits and vegetables, whole grains, lean proteins, low-fat dairy, and heart-healthy fats.  Work with your health care provider or diet and nutrition specialist (dietitian) to adjust your eating plan to your individual calorie needs. This information is not intended to replace advice given to you by your health care provider. Make sure you discuss any questions you have with your health care provider. Document Released: 02/19/2011 Document Revised: 02/24/2016 Document Reviewed: 02/24/2016 Elsevier Interactive Patient Education  2017 [employer].

## 2017-01-04 ENCOUNTER — Encounter: Payer: Self-pay | Admitting: Family Medicine

## 2017-01-20 ENCOUNTER — Encounter (HOSPITAL_COMMUNITY): Payer: Self-pay

## 2017-02-11 ENCOUNTER — Ambulatory Visit: Payer: Self-pay | Admitting: Family Medicine

## 2017-03-22 ENCOUNTER — Ambulatory Visit: Payer: Self-pay | Admitting: Family Medicine

## 2017-07-05 ENCOUNTER — Encounter: Payer: Self-pay | Admitting: Family Medicine

## 2017-07-08 ENCOUNTER — Emergency Department (HOSPITAL_COMMUNITY): Payer: Medicare HMO

## 2017-07-08 ENCOUNTER — Encounter (HOSPITAL_COMMUNITY): Payer: Self-pay

## 2017-07-08 ENCOUNTER — Emergency Department (HOSPITAL_COMMUNITY)
Admission: EM | Admit: 2017-07-08 | Discharge: 2017-07-08 | Disposition: A | Discharge status: Home / Self Care | Payer: Medicare HMO | Attending: Emergency Medicine | Admitting: Emergency Medicine

## 2017-07-08 ENCOUNTER — Other Ambulatory Visit: Payer: Self-pay

## 2017-07-08 DIAGNOSIS — F17210 Nicotine dependence, cigarettes, uncomplicated: Secondary | ICD-10-CM | POA: Diagnosis not present

## 2017-07-08 DIAGNOSIS — N390 Urinary tract infection, site not specified: Secondary | ICD-10-CM

## 2017-07-08 DIAGNOSIS — R319 Hematuria, unspecified: Secondary | ICD-10-CM

## 2017-07-08 DIAGNOSIS — R109 Unspecified abdominal pain: Secondary | ICD-10-CM | POA: Diagnosis not present

## 2017-07-08 DIAGNOSIS — R1031 Right lower quadrant pain: Secondary | ICD-10-CM | POA: Diagnosis present

## 2017-07-08 DIAGNOSIS — Z79899 Other long term (current) drug therapy: Secondary | ICD-10-CM | POA: Diagnosis not present

## 2017-07-08 DIAGNOSIS — R69 Illness, unspecified: Secondary | ICD-10-CM | POA: Diagnosis not present

## 2017-07-08 LAB — COMPREHENSIVE METABOLIC PANEL
ALBUMIN: 4.2 g/dL (ref 3.5–5.0)
ALK PHOS: 83 U/L (ref 38–126)
ALT: 13 U/L — AB (ref 14–54)
AST: 14 U/L — AB (ref 15–41)
Anion gap: 11 (ref 5–15)
BILIRUBIN TOTAL: 1.4 mg/dL — AB (ref 0.3–1.2)
BUN: 16 mg/dL (ref 6–20)
CALCIUM: 9 mg/dL (ref 8.9–10.3)
CO2: 21 mmol/L — ABNORMAL LOW (ref 22–32)
CREATININE: 0.95 mg/dL (ref 0.44–1.00)
Chloride: 106 mmol/L (ref 101–111)
GFR calc Af Amer: >60 mL/min (ref >60)
GLUCOSE: 101 mg/dL — AB (ref 65–99)
Potassium: 3.8 mmol/L (ref 3.5–5.1)
Sodium: 138 mmol/L (ref 135–145)
TOTAL PROTEIN: 7.8 g/dL (ref 6.5–8.1)

## 2017-07-08 LAB — URINALYSIS, ROUTINE W REFLEX MICROSCOPIC
Bilirubin Urine: NEGATIVE
GLUCOSE, UA: NEGATIVE mg/dL
KETONES UR: NEGATIVE mg/dL
NITRITE: POSITIVE — AB
PH: 5 (ref 5.0–8.0)
PROTEIN: 100 mg/dL — AB
RBC / HPF: >50 RBC/hpf — ABNORMAL HIGH (ref 0–5)
Specific Gravity, Urine: 1.015 (ref 1.005–1.030)
WBC, UA: >50 WBC/hpf — ABNORMAL HIGH (ref 0–5)

## 2017-07-08 LAB — CBC
HEMATOCRIT: 44.3 % (ref 36.0–46.0)
Hemoglobin: 14.8 g/dL (ref 12.0–15.0)
MCH: 29.2 pg (ref 26.0–34.0)
MCHC: 33.4 g/dL (ref 30.0–36.0)
MCV: 87.5 fL (ref 78.0–100.0)
PLATELETS: 342 10*3/uL (ref 150–400)
RBC: 5.06 MIL/uL (ref 3.87–5.11)
RDW: 13.7 % (ref 11.5–15.5)
WBC: 14.1 10*3/uL — AB (ref 4.0–10.5)

## 2017-07-08 LAB — LIPASE, BLOOD: Lipase: 45 U/L (ref 11–51)

## 2017-07-08 MED ORDER — ONDANSETRON 4 MG PO TBDP
4.0000 mg | ORAL_TABLET | Freq: Once | ORAL | Status: AC
  Administered 2017-07-08: 4 mg via ORAL
  Filled 2017-07-08: qty 1

## 2017-07-08 MED ORDER — CEPHALEXIN 500 MG PO CAPS: 500.0000 mg | ORAL_CAPSULE | Freq: Three times a day (TID) | ORAL | 0 refills | Status: DC

## 2017-07-08 NOTE — ED Notes (Signed)
Pt has urine culture holding in main lab.

## 2017-07-08 NOTE — ED Triage Notes (Signed)
Pt presents for evaluation of R sided abd pain and flank pain intermittently x 2 days. Endorses nausea. Denies vomiting/diarrhea. Reports dysuria.

## 2017-07-08 NOTE — Discharge Instructions (Signed)
Please read attached information. If you experience any new or worsening signs or symptoms please return to the emergency room for evaluation. Please follow-up with your primary care provider or specialist as discussed. Please use medication prescribed only as directed and discontinue taking if you have any concerning signs or symptoms.   °

## 2017-07-08 NOTE — ED Provider Notes (Signed)
[facility] EMERGENCY DEPARTMENT Provider Note   CSN: [id number] Arrival date & time: 07/08/17  1248     History   Chief Complaint Chief Complaint  Patient presents with  . Abdominal Pain    HPI Summer Norris is a 67 y.o. female.  HPI   67 year old female presents today with complaints of right-sided abdominal pain.  Patient notes a prolonged history over the last several years of intermittent sharp stabbing right flank and lower abdominal pain.  Patient notes over the last 2 days she has had consistent lower abdominal pain, associated nausea and bladder pressure.  She notes chills but denies any fever, denies any vomiting upper abdominal pain diarrhea constipation.   Past Medical History:  Diagnosis Date  . Bell's palsy 03/10/2011   diagnosed in [place]. ongoing R sided facial weakness  . C7 radiculopathy 2016   right  . Carpal tunnel syndrome 2016   right  . Prediabetes   . Smoker     Patient Active Problem List   Diagnosis Date Noted  . Muscle tightness 04/28/2016  . Neck pain 04/28/2016  . Cervical radiculopathy 04/28/2016  . Osteoporosis 09/09/2015  . Cervical disc disorder with radiculopathy of cervical region 01/18/2015  . Carpal tunnel syndrome 01/18/2015  . Vitamin D deficiency 04/21/2012  . Bell's palsy 10/19/2011  . Tobacco use disorder 10/19/2011    Past Surgical History:  Procedure Laterality Date  . ABDOMINAL HYSTERECTOMY    . CATARACT EXTRACTION Bilateral 08/2014 and 09/2014   Dr. Herbert Deaner  . TOTAL ABDOMINAL HYSTERECTOMY W/ BILATERAL SALPINGOOPHORECTOMY  1995     OB History    Gravida  2   Para      Term      Preterm      AB  2   Living  0     SAB  2   TAB      Ectopic      Multiple      Live Births               Home Medications    Prior to Admission medications   Medication Sig Start Date End Date Taking? Authorizing Provider  alendronate (FOSAMAX) 70 MG tablet Take 1 tablet (70 mg total) by  mouth every 7 (seven) days. Take with a full glass of water on an empty stomach. 08/13/16   Rita Ohara, MD  cephALEXin (KEFLEX) 500 MG capsule Take 1 capsule (500 mg total) by mouth 3 (three) times daily. 07/08/17   Jaqualin Serpa, Dellis Filbert, PA-C  cholecalciferol (VITAMIN D) 1000 UNITS tablet Take 1,000 Units by mouth daily. Reported on 09/09/2015    [provider]    Family History Family History  Problem Relation Age of Onset  . Diabetes Mother   . Heart disease Father   . Arthritis Sister   . Diabetes Sister        prediabetes  . Sarcoidosis Brother   . Heart disease Brother 48  . Lymphoma Brother 31  . Cancer Brother 11       lymphoma  . Diabetes Sister        prediabetes    Social History Social History   Tobacco Use  . Smoking status: Current Every Day Smoker    Packs/day: 0.50    Years: 42.00    Pack years: 21.00    Types: E-cigarettes  . Smokeless tobacco: Never Used  . Tobacco comment: vaping  Substance Use Topics  . Alcohol use: Yes  Alcohol/week: 0.0 oz    Comment: 2 drinks per month, maybe.  . Drug use: No     Allergies   Patient has no known allergies.   Review of Systems Review of Systems  All other systems reviewed and are negative.    Physical Exam Updated Vital Signs BP (!) 151/68 (BP Location: Right Arm)   Pulse 90   Temp 98.6 F (37 C) (Oral)   Resp 18   SpO2 97%   Physical Exam  Constitutional: She is oriented to person, place, and time. She appears well-developed and well-nourished.  HENT:  Head: Normocephalic and atraumatic.  Eyes: Pupils are equal, round, and reactive to light. Conjunctivae are normal. Right eye exhibits no discharge. Left eye exhibits no discharge. No scleral icterus.  Neck: Normal range of motion. No JVD present. No tracheal deviation present.  Pulmonary/Chest: Effort normal. No stridor.  Abdominal:  Tenderness palpation right mid lower abdomen- soft, no masses - no cva ttp  Neurological: She is alert and  oriented to person, place, and time. Coordination normal.  Psychiatric: She has a normal mood and affect. Her behavior is normal. Judgment and thought content normal.  Nursing note and vitals reviewed.    ED Treatments / Results  Labs (all labs ordered are listed, but only abnormal results are displayed) Labs Reviewed  COMPREHENSIVE METABOLIC PANEL - Abnormal; Notable for the following components:      Result Value   CO2 21 (*)    Glucose, Bld 101 (*)    AST 14 (*)    ALT 13 (*)    Total Bilirubin 1.4 (*)    All other components within normal limits  CBC - Abnormal; Notable for the following components:   WBC 14.1 (*)    All other components within normal limits  URINALYSIS, ROUTINE W REFLEX MICROSCOPIC - Abnormal; Notable for the following components:   APPearance CLOUDY (*)    Hgb urine dipstick LARGE (*)    Protein, ur 100 (*)    Nitrite POSITIVE (*)    Leukocytes, UA LARGE (*)    RBC / HPF >50 (*)    WBC, UA >50 (*)    Bacteria, UA MANY (*)    Non Squamous Epithelial 0-5 (*)    All other components within normal limits  URINE CULTURE  LIPASE, BLOOD    EKG None  Radiology Ct Renal Stone Study  Result Date: 07/08/2017 CLINICAL DATA:  Flank pain right-sided EXAM: CT ABDOMEN AND PELVIS WITHOUT CONTRAST TECHNIQUE: Multidetector CT imaging of the abdomen and pelvis was performed following the standard protocol without IV contrast. COMPARISON:  None. FINDINGS: Lower chest: Scattered subpleural small nodules in the right lower lobe and right middle lobe. 9 mm left lower lobe pulmonary nodule, series 5, image number 9. No pleural effusion. Normal heart size. Hepatobiliary: No calcified gallstones or biliary dilatation. Subcentimeter hypodense liver lesions too small to further characterize. 12 mm cystic lesion anterior liver with possible internal density. 2.7 cm cystic lesion within the right lobe, appears to contain septations and calcification. Pancreas: Unremarkable. No  pancreatic ductal dilatation or surrounding inflammatory changes. Spleen: Normal in size without focal abnormality. Adrenals/Urinary Tract: Adrenal glands are within normal limits. No hydronephrosis. Mild soft tissue stranding near the right renal pelvis and around the proximal right ureter. No definitive stones are seen along the course of the ureter. The bladder is negative. Small stone or parenchymal calcification lower pole left kidney. Stomach/Bowel: Stomach is within normal limits. Appendix appears normal. No  evidence of bowel wall thickening, distention, or inflammatory changes. Vascular/Lymphatic: Moderate aortic atherosclerosis. No aneurysmal dilatation. No significantly enlarged lymph nodes Reproductive: Status post hysterectomy. No adnexal masses. Other: Negative for free air or free fluid. Musculoskeletal: No acute or significant osseous findings. IMPRESSION: 1. No hydronephrosis or ureteral stone. Minimal soft tissue stranding near the right renal pelvis and around the proximal right ureter, could relate to mild ascending infection versus recently passed stone. 2. Slightly complex appearing cystic lesions in the liver. When the patient is clinically stable and able to follow directions and hold their breath (preferably as an outpatient) further evaluation with dedicated abdominal MRI should be considered. 3. 9 mm left lower lobe pulmonary nodule. Consider one of the following in 3 months for both low-risk and high-risk individuals: (a) repeat chest CT, (b) follow-up PET-CT, or (c) tissue sampling. This recommendation follows the consensus statement: Guidelines for Management of Incidental Pulmonary Nodules Detected on CT Images: From the Fleischner Society 2017; Radiology 2017; 284:228-243. Electronically Signed   By: Donavan Foil M.D.   On: 07/08/2017 14:47    Procedures Procedures (including critical care time)  Medications Ordered in ED Medications  ondansetron (ZOFRAN-ODT) disintegrating  tablet 4 mg (4 mg Oral Given 07/08/17 1321)     Initial Impression / Assessment and Plan / ED Course  I have reviewed the triage vital signs and the nursing notes.  Pertinent labs & imaging results that were available during my care of the patient were reviewed by me and considered in my medical decision making (see chart for details).     Labs: Urinalysis, lipase, CMP, CBC  Imaging: CT renal  Consults:  Therapeutics:  Discharge Meds: Keflex  Assessment/Plan: 67 year old female presents today with urinary tract infection.  Patient has slight elevation in white blood cells, no fever or tachycardia.  Patient has CT scan showing minimal soft tissue stranding near the right renal pelvis around proximal right ureter, more likely secondary to urinary tract infection given nitrite positive and leukocyte positive urine.  Patient is very well-appearing in no acute distress.  Patient will be discharged home on Keflex 3 times daily x10 days given her elevated WBC and likely ascending infection.  Patient is given strict return precautions, she verbalized understanding and agreement to today's plan had no further questions or concerns.     Final Clinical Impressions(s) / ED Diagnoses   Final diagnoses:  Urinary tract infection with hematuria, site unspecified    ED Discharge Orders        Ordered    cephALEXin (KEFLEX) 500 MG capsule  3 times daily     07/08/17 1631       Okey Regal, PA-C 07/08/17 1632    Dorie Rank, MD 07/09/17 [phone]

## 2017-07-08 NOTE — ED Provider Notes (Signed)
Patient placed in Quick Look pathway, seen and evaluated   Chief Complaint: R flank pain x2 days  HPI:   Pt is a 67 y.o. y/o female  with a PMHx of bell's palsy, prediabetes, and carpal tunnel syndrome, with a PSHx of abd hysterectomy, presenting today with c/o right-sided flank pain for last 2 days, with associated nausea, chills, and pressure-like dysuria.  She denies any fevers, vomiting, diarrhea, constipation, melena, hematochezia, hematuria, or any other complaints at this time.  She denies personal history of kidney stones but reports that her brother has had some before.  She still has her gallbladder.  ROS: +R flank pain, +nausea, +chills, +dysuria  Physical Exam:  BP (!) 151/68 (BP Location: Right Arm)   Pulse 90   Temp 98.6 F (37 C) (Oral)   Resp 18   SpO2 97%    Gen: No distress  Neuro: Awake and Alert  Skin: Warm    Focused Exam: abdomen Soft, nondistended, +BS throughout, with mild R flank/lateral abdominal TTP, no RUQ TTP or anterior abd TTP, no r/g/r, neg murphy's, neg mcburney's, no CVA TTP but tenderness in the R flank area    Initiation of care has begun. The patient has been counseled on the process, plan, and necessity for staying for the completion/evaluation, and the remainder of the medical screening examination     [address], [place], [place] 07/08/17 [place]    Macarthur Critchley, MD 07/14/17 1448

## 2017-07-11 LAB — URINE CULTURE: Culture: >=100000 — AB

## 2017-07-12 ENCOUNTER — Telehealth: Payer: Self-pay | Admitting: Emergency Medicine

## 2017-07-12 NOTE — Telephone Encounter (Signed)
Post ED Visit - Positive Culture Follow-up  Culture report reviewed by antimicrobial stewardship pharmacist:  []  Elenor Quinones, Pharm.D. []  Heide Guile, Pharm.D., BCPS AQ-ID []  Parks Neptune, Pharm.D., BCPS [x]  Alycia Rossetti, Pharm.D., BCPS []  [place], Pharm.D., BCPS, AAHIVP []  Legrand Como, Pharm.D., BCPS, AAHIVP []  Salome Arnt, PharmD, BCPS []  Jalene Mullet, PharmD []  Vincenza Hews, PharmD, BCPS  Positive urine culture Treated with cephalexin, organism sensitive to the same and no further patient follow-up is required at this time.  Hazle Nordmann 07/12/2017, 1:34 PM

## 2017-07-25 ENCOUNTER — Other Ambulatory Visit: Payer: Self-pay | Admitting: Family Medicine

## 2017-07-25 DIAGNOSIS — M810 Age-related osteoporosis without current pathological fracture: Secondary | ICD-10-CM

## 2017-07-26 NOTE — Telephone Encounter (Signed)
cx CPE 07/05/17, do you want me to call her and schedule a med check?

## 2017-07-26 NOTE — Telephone Encounter (Signed)
Next DEXA due in June. Okay to refill #4 only, no refill, and schedule med check. (do we know why she canceled? Does she have insurance?)

## 2017-07-31 ENCOUNTER — Ambulatory Visit (HOSPITAL_COMMUNITY)
Admission: EM | Admit: 2017-07-31 | Discharge: 2017-07-31 | Disposition: A | Discharge status: Home / Self Care | Payer: Medicare HMO | Attending: Internal Medicine | Admitting: Internal Medicine

## 2017-07-31 ENCOUNTER — Encounter (HOSPITAL_COMMUNITY): Payer: Self-pay | Admitting: *Deleted

## 2017-07-31 ENCOUNTER — Other Ambulatory Visit: Payer: Self-pay

## 2017-07-31 DIAGNOSIS — Z79899 Other long term (current) drug therapy: Secondary | ICD-10-CM | POA: Diagnosis not present

## 2017-07-31 DIAGNOSIS — R300 Dysuria: Secondary | ICD-10-CM

## 2017-07-31 DIAGNOSIS — R7303 Prediabetes: Secondary | ICD-10-CM | POA: Insufficient documentation

## 2017-07-31 DIAGNOSIS — N390 Urinary tract infection, site not specified: Secondary | ICD-10-CM | POA: Diagnosis not present

## 2017-07-31 DIAGNOSIS — F17290 Nicotine dependence, other tobacco product, uncomplicated: Secondary | ICD-10-CM | POA: Diagnosis not present

## 2017-07-31 DIAGNOSIS — R339 Retention of urine, unspecified: Secondary | ICD-10-CM | POA: Diagnosis present

## 2017-07-31 DIAGNOSIS — M549 Dorsalgia, unspecified: Secondary | ICD-10-CM | POA: Diagnosis present

## 2017-07-31 DIAGNOSIS — R69 Illness, unspecified: Secondary | ICD-10-CM | POA: Diagnosis not present

## 2017-07-31 LAB — POCT URINALYSIS DIP (DEVICE)
Bilirubin Urine: NEGATIVE
GLUCOSE, UA: NEGATIVE mg/dL
Ketones, ur: NEGATIVE mg/dL
Nitrite: POSITIVE — AB
PROTEIN: 100 mg/dL — AB
Specific Gravity, Urine: >=1.03 (ref 1.005–1.030)
UROBILINOGEN UA: 0.2 mg/dL (ref 0.0–1.0)
pH: 5.5 (ref 5.0–8.0)

## 2017-07-31 MED ORDER — SULFAMETHOXAZOLE-TRIMETHOPRIM 800-160 MG PO TABS: 1.0000 | ORAL_TABLET | Freq: Two times a day (BID) | ORAL | 0 refills | Status: AC

## 2017-07-31 NOTE — ED Provider Notes (Signed)
[facility]    CSN: [id number] Arrival date & time: 07/31/17  1205     History   Chief Complaint Chief Complaint  Patient presents with  . Urinary Retention  . Back Pain    HPI Summer Norris is a 67 y.o. female.   Summer Norris presents with complaints of persistent symptoms or pressure to the pelvis and pain with urination as well as frequency of urination. Was treated with keflex tid x 10days started on 4/25 for UTI- culture positive for e.coli and klebsiella. She states her symptoms have improved some but have not felt to have resolved. Denies any pain when not urinating although does have mild bilateral low back pain. States feels feverish in the mornings. States years ago had UTI's but denies recent UTI concerns. No blood in her urine. CBC showed WBC 14 4/25 and CT showed "Minimal soft tissue stranding near the right renal pelvis and around the proximal right ureter, could relate to mild ascending infection versus recently passed stone."  Hx neck pain osteoporosis, vitamin d.    ROS per HPI.      Past Medical History:  Diagnosis Date  . Bell's palsy 03/10/2011   diagnosed in [place]. ongoing R sided facial weakness  . C7 radiculopathy 2016   right  . Carpal tunnel syndrome 2016   right  . Prediabetes   . Smoker     Patient Active Problem List   Diagnosis Date Noted  . Muscle tightness 04/28/2016  . Neck pain 04/28/2016  . Cervical radiculopathy 04/28/2016  . Osteoporosis 09/09/2015  . Cervical disc disorder with radiculopathy of cervical region 01/18/2015  . Carpal tunnel syndrome 01/18/2015  . Vitamin D deficiency 04/21/2012  . Bell's palsy 10/19/2011  . Tobacco use disorder 10/19/2011    Past Surgical History:  Procedure Laterality Date  . ABDOMINAL HYSTERECTOMY    . CATARACT EXTRACTION Bilateral 08/2014 and 09/2014   Dr. Herbert Deaner  . TOTAL ABDOMINAL HYSTERECTOMY W/ BILATERAL SALPINGOOPHORECTOMY  1995    OB History    Gravida  2   Para        Term      Preterm      AB  2   Living  0     SAB  2   TAB      Ectopic      Multiple      Live Births               Home Medications    Prior to Admission medications   Medication Sig Start Date End Date Taking? Authorizing Provider  alendronate (FOSAMAX) 70 MG tablet TAKE 1 TABLET EVERY 7 DAYS WITH A FULL GLASS OF WATER AND ON EMPTY STOMACH 07/26/17  Yes Rita Ohara, MD  cholecalciferol (VITAMIN D) 1000 UNITS tablet Take 1,000 Units by mouth daily. Reported on 09/09/2015   Yes [provider]  sulfamethoxazole-trimethoprim (BACTRIM DS,SEPTRA DS) 800-160 MG tablet Take 1 tablet by mouth 2 (two) times daily for 14 days. 07/31/17 08/14/17  Zigmund Gottron, NP    Family History Family History  Problem Relation Age of Onset  . Diabetes Mother   . Heart disease Father   . Arthritis Sister   . Diabetes Sister        prediabetes  . Sarcoidosis Brother   . Heart disease Brother 40  . Lymphoma Brother 87  . Cancer Brother 57       lymphoma  . Diabetes Sister  prediabetes    Social History Social History   Tobacco Use  . Smoking status: Current Every Day Smoker    Packs/day: 0.50    Years: 42.00    Pack years: 21.00    Types: E-cigarettes  . Smokeless tobacco: Never Used  . Tobacco comment: vaping  Substance Use Topics  . Alcohol use: Yes    Alcohol/week: 0.0 oz    Comment: 2 drinks per month, maybe.  . Drug use: No     Allergies   Patient has no known allergies.   Review of Systems Review of Systems   Physical Exam Triage Vital Signs ED Triage Vitals [07/31/17 1239]  Enc Vitals Group     BP 135/61     Pulse Rate 74     Resp      Temp 98.3 F (36.8 C)     Temp Source Oral     SpO2 94 %     Weight      Height      Head Circumference      Peak Flow      Pain Score      Pain Loc      Pain Edu?      Excl. in [place]?    No data found.  Updated Vital Signs BP 135/61 (BP Location: Left Arm)   Pulse 74   Temp 98.3 F (36.8  C) (Oral)   SpO2 94%   Visual Acuity Right Eye Distance:   Left Eye Distance:   Bilateral Distance:    Right Eye Near:   Left Eye Near:    Bilateral Near:     Physical Exam  Constitutional: She is oriented to person, place, and time. She appears well-developed and well-nourished. No distress.  Cardiovascular: Normal rate, regular rhythm and normal heart sounds.  Pulmonary/Chest: Effort normal and breath sounds normal.  Abdominal: There is no rigidity, no rebound, no guarding and no CVA tenderness.  Very mild suprapubic "discomfort" on palpation   Neurological: She is alert and oriented to person, place, and time.  Skin: Skin is warm and dry.     UC Treatments / Results  Labs (all labs ordered are listed, but only abnormal results are displayed) Labs Reviewed  POCT URINALYSIS DIP (DEVICE) - Abnormal; Notable for the following components:      Result Value   Hgb urine dipstick LARGE (*)    Protein, ur 100 (*)    Nitrite POSITIVE (*)    Leukocytes, UA SMALL (*)    All other components within normal limits  URINE CULTURE    EKG None  Radiology No results found.  Procedures Procedures (including critical care time)  Medications Ordered in UC Medications - No data to display  Initial Impression / Assessment and Plan / UC Course  I have reviewed the triage vital signs and the nursing notes.  Pertinent labs & imaging results that were available during my care of the patient were reviewed by me and considered in my medical decision making (see chart for details).     Still with leuks, nitrite and hgb to urine as well as symptoms. Previous culture does appear susceptible to bactrim, initiated at this time and repeat culture obtained and pending. Non toxic in appearance. Afebrile. Otherwise without any red flag findings. encouraged follow up for recheck with PCP as this has been somewhat recurrent/ongoing. Patient verbalized understanding and agreeable to plan.     Final Clinical Impressions(s) / UC Diagnoses   Final diagnoses:  Urinary tract infection without hematuria, site unspecified     Discharge Instructions     Your urine appears still with infection today. I have switched your antibiotic. Please complete course. I have sent another culture of your urine to ensure this should still treat your current infection. Please increase the amount of water you are drinking to empty your bladder regularly. Please follow up with your primary care provider in the next 1-2 weeks for recheck.     ED Prescriptions    Medication Sig Dispense Auth. Provider   sulfamethoxazole-trimethoprim (BACTRIM DS,SEPTRA DS) 800-160 MG tablet Take 1 tablet by mouth 2 (two) times daily for 14 days. 28 tablet Zigmund Gottron, NP     Controlled Substance Prescriptions [place] Controlled Substance Registry consulted? Not Applicable   Zigmund Gottron, NP 07/31/17 1322

## 2017-07-31 NOTE — ED Triage Notes (Signed)
07-08-2017 went to the ER and was given Cephalexin 500 mg. Per pt she is having a lot of pressure when she tries to urinate. Per pt she is also having to go frequently as well.

## 2017-07-31 NOTE — Discharge Instructions (Signed)
Your urine appears still with infection today. I have switched your antibiotic. Please complete course. I have sent another culture of your urine to ensure this should still treat your current infection. Please increase the amount of water you are drinking to empty your bladder regularly. Please follow up with your primary care provider in the next 1-2 weeks for recheck.

## 2017-08-01 ENCOUNTER — Telehealth (HOSPITAL_COMMUNITY): Payer: Self-pay | Admitting: Family Medicine

## 2017-08-01 ENCOUNTER — Telehealth (HOSPITAL_COMMUNITY): Payer: Self-pay | Admitting: Emergency Medicine

## 2017-08-01 MED ORDER — NITROFURANTOIN MONOHYD MACRO 100 MG PO CAPS: 100.0000 mg | ORAL_CAPSULE | Freq: Two times a day (BID) | ORAL | 0 refills | Status: DC

## 2017-08-01 NOTE — Telephone Encounter (Signed)
Patient states she had a reaction to the sulfa antibiotic she was given yesterday.  Every time she takes a pill for 30 minutes her lips and mouth itch terribly.  I told her this was unusual for an allergy, but she needs to stop the medication I gave her Macrodantin instead.

## 2017-08-02 ENCOUNTER — Telehealth (HOSPITAL_COMMUNITY): Payer: Self-pay

## 2017-08-02 LAB — URINE CULTURE: Culture: >=100000 — AB

## 2017-08-02 NOTE — Telephone Encounter (Signed)
Urine culture positive for E.coli, this was treated with Macrobid at urgent care visit. Patient was reached by Dr. Meda Coffee over the weekend.

## 2017-08-21 ENCOUNTER — Other Ambulatory Visit: Payer: Self-pay | Admitting: Family Medicine

## 2017-08-21 DIAGNOSIS — M810 Age-related osteoporosis without current pathological fracture: Secondary | ICD-10-CM

## 2017-08-23 NOTE — Telephone Encounter (Signed)
Pt scheduled appt on June 24th for med check. I will refill med

## 2017-08-23 NOTE — Telephone Encounter (Signed)
Looks like she has canceled 3 appts.  She is due for DEXA this month, which is why she is due for refill.  She needs to be set up for a med check. Okay to refill x 1 month (#4) and schedule med check. Thanks

## 2017-08-23 NOTE — Telephone Encounter (Signed)
Is this okay to refill? Pt does not have an upcoming appt which I will call to get this scheduled.
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
## 2017-09-06 ENCOUNTER — Encounter: Payer: Self-pay | Admitting: Family Medicine

## 2017-09-06 ENCOUNTER — Ambulatory Visit (INDEPENDENT_AMBULATORY_CARE_PROVIDER_SITE_OTHER): Payer: Medicare HMO | Admitting: Family Medicine

## 2017-09-06 ENCOUNTER — Other Ambulatory Visit: Payer: Self-pay | Admitting: Family Medicine

## 2017-09-06 VITALS — BP 112/68 | HR 80 | Ht 61.0 in | Wt 151.8 lb

## 2017-09-06 DIAGNOSIS — Z720 Tobacco use: Secondary | ICD-10-CM | POA: Diagnosis not present

## 2017-09-06 DIAGNOSIS — R634 Abnormal weight loss: Secondary | ICD-10-CM | POA: Diagnosis not present

## 2017-09-06 DIAGNOSIS — M810 Age-related osteoporosis without current pathological fracture: Secondary | ICD-10-CM | POA: Diagnosis not present

## 2017-09-06 DIAGNOSIS — R809 Proteinuria, unspecified: Secondary | ICD-10-CM

## 2017-09-06 DIAGNOSIS — Z1231 Encounter for screening mammogram for malignant neoplasm of breast: Secondary | ICD-10-CM

## 2017-09-06 DIAGNOSIS — E559 Vitamin D deficiency, unspecified: Secondary | ICD-10-CM | POA: Diagnosis not present

## 2017-09-06 LAB — POCT URINALYSIS DIP (PROADVANTAGE DEVICE)
BILIRUBIN UA: NEGATIVE
Blood, UA: NEGATIVE
GLUCOSE UA: NEGATIVE mg/dL
Ketones, POC UA: NEGATIVE mg/dL
LEUKOCYTES UA: NEGATIVE
NITRITE UA: NEGATIVE
Protein Ur, POC: 30 mg/dL — AB
Specific Gravity, Urine: 1.025
Urobilinogen, Ur: NEGATIVE
pH, UA: 6 (ref 5.0–8.0)

## 2017-09-06 LAB — POCT GLYCOSYLATED HEMOGLOBIN (HGB A1C): Hemoglobin A1C: 5.7 % — AB (ref 4.0–5.6)

## 2017-09-06 MED ORDER — ALENDRONATE SODIUM 70 MG PO TABS: ORAL_TABLET | ORAL | 3 refills | Status: DC

## 2017-09-06 NOTE — Patient Instructions (Signed)
  Since you don't eat a well rounded diet (limited veggies), please consider taking a multivitamin daily. Continue to eat calcium-rich foods.  Your blood pressure is good--continue low sodium diet; regular exercise will also hellp.  Your hemoglobin A1c test shows borderline pre-diabetes.  Limit the sugars in your diet to help prevent sugars from getting worse.  Limit sugar, regular soda, sweet tea, juices, limit sweets, and carb portions (bread/rice/pasta/potatoes). Regular exercise also keeps the sugar down. It is recommended that you get at least 30 minutes of aerobic exercise at least 5 days/week (for weight loss, you may need as much as 60-90 minutes). This can be any activity that gets your heart rate up. This can be divided in 10-15 minute intervals if needed, but try and build up your endurance at least once a week.  Weight bearing exercise is also recommended twice weekly.  You still have some protein in your urine, but no evidence of infection at this time.  Please call to check when your last mammogram was.  I only see the one from 05/2015 done at the [facility]. If you had another one (ie at [place]), I need those results. Continue yearly mammograms, 3-D type is recommended.  You are due for your follow-up Bone density test. Call the [facility] to schedule this.  You declined getting your pneumonia shot today.  We will plan on giving you your Prevnar-13 shot at your wellness visit.

## 2017-09-07 LAB — TSH: TSH: 0.476 u[IU]/mL (ref 0.450–4.500)

## 2017-09-07 LAB — VITAMIN D 25 HYDROXY (VIT D DEFICIENCY, FRACTURES): VIT D 25 HYDROXY: 14.5 ng/mL — AB (ref 30.0–100.0)

## 2017-09-08 ENCOUNTER — Other Ambulatory Visit: Payer: Self-pay | Admitting: *Deleted

## 2017-09-08 MED ORDER — ERGOCALCIFEROL 1.25 MG (50000 UT) PO CAPS: 50000.0000 [IU] | ORAL_CAPSULE | ORAL | 0 refills | Status: DC

## 2017-09-12 ENCOUNTER — Ambulatory Visit (HOSPITAL_COMMUNITY)
Admission: EM | Admit: 2017-09-12 | Discharge: 2017-09-12 | Disposition: A | Discharge status: Home / Self Care | Payer: Medicare HMO | Attending: Family Medicine | Admitting: Family Medicine

## 2017-09-12 ENCOUNTER — Encounter (HOSPITAL_COMMUNITY): Payer: Self-pay | Admitting: Emergency Medicine

## 2017-09-12 DIAGNOSIS — H9202 Otalgia, left ear: Secondary | ICD-10-CM | POA: Diagnosis not present

## 2017-09-12 DIAGNOSIS — H66002 Acute suppurative otitis media without spontaneous rupture of ear drum, left ear: Secondary | ICD-10-CM | POA: Diagnosis not present

## 2017-09-12 MED ORDER — AMOXICILLIN 875 MG PO TABS: 875.0000 mg | ORAL_TABLET | Freq: Two times a day (BID) | ORAL | 0 refills | Status: DC

## 2017-09-12 MED ORDER — IBUPROFEN 800 MG PO TABS: 800.0000 mg | ORAL_TABLET | Freq: Three times a day (TID) | ORAL | 0 refills | Status: DC

## 2017-09-12 NOTE — ED Triage Notes (Signed)
Pt c/o L ear pain since wednseday.

## 2017-09-12 NOTE — ED Provider Notes (Signed)
[facility]    CSN: [id number] Arrival date & time: 09/12/17  1200     History   Chief Complaint Chief Complaint  Patient presents with  . Otalgia    HPI Summer Norris is a 67 y.o. female.   HPI  Ear pain on left since Wednesday.  No loss of hearing.  No drainage.  No cold symptoms or allergies.  No prior ear problems.  States is in good health.  Only medicine is Fosamax.  No fever or chills.  No nausea or vomiting.  Took ibuprofen 400 mg for pain, got mild relief.  States the pain is worse when she swallows.  No sore throat  Past Medical History:  Diagnosis Date  . Bell's palsy 03/10/2011   diagnosed in [place]. ongoing R sided facial weakness  . C7 radiculopathy 2016   right  . Carpal tunnel syndrome 2016   right  . Prediabetes   . Smoker     Patient Active Problem List   Diagnosis Date Noted  . Muscle tightness 04/28/2016  . Neck pain 04/28/2016  . Cervical radiculopathy 04/28/2016  . Osteoporosis 09/09/2015  . Cervical disc disorder with radiculopathy of cervical region 01/18/2015  . Carpal tunnel syndrome 01/18/2015  . Vitamin D deficiency 04/21/2012  . Bell's palsy 10/19/2011  . Tobacco use disorder 10/19/2011    Past Surgical History:  Procedure Laterality Date  . ABDOMINAL HYSTERECTOMY    . CATARACT EXTRACTION Bilateral 08/2014 and 09/2014   Dr. Herbert Deaner  . TOTAL ABDOMINAL HYSTERECTOMY W/ BILATERAL SALPINGOOPHORECTOMY  1995    OB History    Gravida  2   Para      Term      Preterm      AB  2   Living  0     SAB  2   TAB      Ectopic      Multiple      Live Births               Home Medications    Prior to Admission medications   Medication Sig Start Date End Date Taking? Authorizing Provider  alendronate (FOSAMAX) 70 MG tablet TAKE 1 TABLET EVERY 7 DAYS WITH A FULL GLASS OF WATER AND ON EMPTY STOMACH 09/06/17   Rita Ohara, MD  amoxicillin (AMOXIL) 875 MG tablet Take 1 tablet (875 mg total) by mouth 2 (two)  times daily. 09/12/17   Raylene Everts, MD  cholecalciferol (VITAMIN D) 1000 UNITS tablet Take 5,000 Units by mouth daily. Reported on 09/09/2015    [provider]  ergocalciferol (VITAMIN D2) 50000 units capsule Take 1 capsule (50,000 Units total) by mouth once a week. 09/08/17   Rita Ohara, MD  ibuprofen (ADVIL,MOTRIN) 800 MG tablet Take 1 tablet (800 mg total) by mouth 3 (three) times daily. 09/12/17   Raylene Everts, MD    Family History Family History  Problem Relation Age of Onset  . Diabetes Mother   . Heart disease Father   . Arthritis Sister   . Diabetes Sister        prediabetes  . Sarcoidosis Brother   . Heart disease Brother 65  . Lymphoma Brother 26  . Cancer Brother 70       lymphoma  . Diabetes Sister        prediabetes    Social History Social History   Tobacco Use  . Smoking status: Current Every Day Smoker    Packs/day:  0.25    Years: 42.00    Pack years: 10.50    Types: E-cigarettes, Cigarettes  . Smokeless tobacco: Never Used  Substance Use Topics  . Alcohol use: Yes    Alcohol/week: 0.0 oz    Comment: 0-1 drinks per month, maybe.  . Drug use: No     Allergies   Septra [sulfamethoxazole-trimethoprim]   Review of Systems Review of Systems  Constitutional: Negative for chills and fever.  HENT: Positive for ear pain and trouble swallowing. Negative for ear discharge, hearing loss, sinus pressure, sinus pain and sore throat.   Eyes: Negative for pain and visual disturbance.  Respiratory: Negative for cough and shortness of breath.   Cardiovascular: Negative for chest pain and palpitations.  Gastrointestinal: Negative for abdominal pain and vomiting.  Genitourinary: Negative for dysuria and hematuria.  Musculoskeletal: Negative for arthralgias and back pain.  Skin: Negative for color change and rash.  Neurological: Negative for seizures and syncope.  All other systems reviewed and are negative.    Physical Exam Triage Vital  Signs ED Triage Vitals  Enc Vitals Group     BP 09/12/17 1220 (!) 141/73     Pulse Rate 09/12/17 1220 69     Resp 09/12/17 1220 18     Temp 09/12/17 1220 98.2 F (36.8 C)     Temp src --      SpO2 09/12/17 1220 95 %     Weight --      Height --      Head Circumference --      Peak Flow --      Pain Score 09/12/17 1221 5     Pain Loc --      Pain Edu? --      Excl. in [place]? --    No data found.  Updated Vital Signs BP (!) 141/73   Pulse 69   Temp 98.2 F (36.8 C)   Resp 18   SpO2 95%   Visual Acuity Right Eye Distance:   Left Eye Distance:   Bilateral Distance:    Right Eye Near:   Left Eye Near:    Bilateral Near:     Physical Exam  Constitutional: She appears well-developed and well-nourished. No distress.  HENT:  Head: Normocephalic and atraumatic.  Right Ear: External ear normal.  Nose: Nose normal.  Mouth/Throat: Oropharynx is clear and moist.  Left canal slightly swollen.  TM is dull, injected periphery.  Mild pain with traction of the pinna.  Posterior pharynx mildly injected.  No exudate.  Eyes: Pupils are equal, round, and reactive to light. Conjunctivae are normal.  Neck: Normal range of motion.  Left angle of jaw  Cardiovascular: Normal rate.  Pulmonary/Chest: Effort normal. No respiratory distress.  Abdominal: Soft. She exhibits no distension.  Musculoskeletal: Normal range of motion. She exhibits no edema.  Lymphadenopathy:    She has cervical adenopathy.  Neurological: She is alert.  Skin: Skin is warm and dry.     UC Treatments / Results  Labs (all labs ordered are listed, but only abnormal results are displayed) Labs Reviewed - No data to display  EKG None  Radiology No results found.  Procedures Procedures (including critical care time)  Medications Ordered in UC Medications - No data to display  Initial Impression / Assessment and Plan / UC Course  I have reviewed the triage vital signs and the nursing notes.  Pertinent  labs & imaging results that were available during my care of the patient  were reviewed by me and considered in my medical decision making (see chart for details).      Final Clinical Impressions(s) / UC Diagnoses   Final diagnoses:  Otalgia of left ear  Non-recurrent acute suppurative otitis media of left ear without spontaneous rupture of tympanic membrane     Discharge Instructions     Take the amoxicillin as prescribed Take 2 doses today Take ibuprofen for pain Return if not better in a few days   ED Prescriptions    Medication Sig Dispense Auth. Provider   amoxicillin (AMOXIL) 875 MG tablet Take 1 tablet (875 mg total) by mouth 2 (two) times daily. 20 tablet Raylene Everts, MD   ibuprofen (ADVIL,MOTRIN) 800 MG tablet Take 1 tablet (800 mg total) by mouth 3 (three) times daily. 21 tablet Raylene Everts, MD     Controlled Substance Prescriptions [place] Controlled Substance Registry consulted? Not Applicable   Raylene Everts, MD 09/12/17 1253

## 2017-09-12 NOTE — Discharge Instructions (Signed)
Take the amoxicillin as prescribed Take 2 doses today Take ibuprofen for pain Return if not better in a few days

## 2017-10-19 ENCOUNTER — Ambulatory Visit
Admission: RE | Admit: 2017-10-19 | Discharge: 2017-10-19 | Disposition: A | Discharge status: Home / Self Care | Payer: Medicare HMO | Source: Ambulatory Visit | Attending: Family Medicine | Admitting: Family Medicine

## 2017-10-19 DIAGNOSIS — Z1231 Encounter for screening mammogram for malignant neoplasm of breast: Secondary | ICD-10-CM | POA: Diagnosis not present

## 2017-10-19 DIAGNOSIS — M8588 Other specified disorders of bone density and structure, other site: Secondary | ICD-10-CM | POA: Diagnosis not present

## 2017-10-19 DIAGNOSIS — M810 Age-related osteoporosis without current pathological fracture: Secondary | ICD-10-CM

## 2017-10-19 DIAGNOSIS — Z780 Asymptomatic menopausal state: Secondary | ICD-10-CM | POA: Diagnosis not present

## 2017-11-30 ENCOUNTER — Telehealth: Payer: Self-pay | Admitting: Family Medicine

## 2017-11-30 DIAGNOSIS — Z13220 Encounter for screening for lipoid disorders: Secondary | ICD-10-CM

## 2017-11-30 DIAGNOSIS — R7303 Prediabetes: Secondary | ICD-10-CM

## 2017-11-30 NOTE — Telephone Encounter (Signed)
Pt called and she would like to come in Friday for labs. Please advise and place orders if ok.

## 2017-11-30 NOTE — Telephone Encounter (Signed)
I extended future orders that she had which had expired.  I think the nurses will need to change the lab though, now that I think about it (to [facility]).  I added lipids to what was previously ordered, but want to make sure it all gets done.  Sending to [place] to change the previous orders, to ensure it all gets done when she comes on Friday.

## 2017-12-01 NOTE — Telephone Encounter (Signed)
Done

## 2017-12-01 NOTE — Addendum Note (Signed)
Addended by: Carolee Rota F on: 12/01/2017 12:01 PM   Modules accepted: Orders

## 2017-12-01 NOTE — Telephone Encounter (Signed)
Left message for patient to call back and schedule lab appt, then I will switch orders to [place].

## 2017-12-03 ENCOUNTER — Other Ambulatory Visit: Payer: Medicare HMO

## 2017-12-03 DIAGNOSIS — R030 Elevated blood-pressure reading, without diagnosis of hypertension: Secondary | ICD-10-CM | POA: Diagnosis not present

## 2017-12-03 DIAGNOSIS — E559 Vitamin D deficiency, unspecified: Secondary | ICD-10-CM | POA: Diagnosis not present

## 2017-12-03 DIAGNOSIS — Z13220 Encounter for screening for lipoid disorders: Secondary | ICD-10-CM | POA: Diagnosis not present

## 2017-12-03 DIAGNOSIS — R635 Abnormal weight gain: Secondary | ICD-10-CM

## 2017-12-03 DIAGNOSIS — R42 Dizziness and giddiness: Secondary | ICD-10-CM | POA: Diagnosis not present

## 2017-12-03 DIAGNOSIS — E669 Obesity, unspecified: Secondary | ICD-10-CM | POA: Diagnosis not present

## 2017-12-03 DIAGNOSIS — R7303 Prediabetes: Secondary | ICD-10-CM | POA: Diagnosis not present

## 2017-12-04 LAB — CBC WITH DIFFERENTIAL/PLATELET
BASOS: 0 %
Basophils Absolute: 0 10*3/uL (ref 0.0–0.2)
EOS (ABSOLUTE): 0.2 10*3/uL (ref 0.0–0.4)
EOS: 2 %
Hematocrit: 44.9 % (ref 34.0–46.6)
Hemoglobin: 14.7 g/dL (ref 11.1–15.9)
IMMATURE GRANS (ABS): 0 10*3/uL (ref 0.0–0.1)
IMMATURE GRANULOCYTES: 0 %
Lymphocytes Absolute: 2.1 10*3/uL (ref 0.7–3.1)
Lymphs: 23 %
MCH: 28.2 pg (ref 26.6–33.0)
MCHC: 32.7 g/dL (ref 31.5–35.7)
MCV: 86 fL (ref 79–97)
Monocytes Absolute: 0.6 10*3/uL (ref 0.1–0.9)
Monocytes: 6 %
NEUTROS PCT: 69 %
Neutrophils Absolute: 6.1 10*3/uL (ref 1.4–7.0)
Platelets: 410 10*3/uL (ref 150–450)
RBC: 5.21 x10E6/uL (ref 3.77–5.28)
RDW: 13.1 % (ref 12.3–15.4)
WBC: 9 10*3/uL (ref 3.4–10.8)

## 2017-12-04 LAB — COMPREHENSIVE METABOLIC PANEL
A/G RATIO: 1.6 (ref 1.2–2.2)
ALK PHOS: 84 IU/L (ref 39–117)
ALT: 11 IU/L (ref 0–32)
AST: 15 IU/L (ref 0–40)
Albumin: 4.6 g/dL (ref 3.6–4.8)
BILIRUBIN TOTAL: 0.7 mg/dL (ref 0.0–1.2)
BUN/Creatinine Ratio: 10 — ABNORMAL LOW (ref 12–28)
BUN: 9 mg/dL (ref 8–27)
CHLORIDE: 103 mmol/L (ref 96–106)
CO2: 21 mmol/L (ref 20–29)
Calcium: 9.8 mg/dL (ref 8.7–10.3)
Creatinine, Ser: 0.9 mg/dL (ref 0.57–1.00)
GFR calc non Af Amer: 67 mL/min/{1.73_m2} (ref >59)
GFR, EST AFRICAN AMERICAN: 78 mL/min/{1.73_m2} (ref >59)
Globulin, Total: 2.9 g/dL (ref 1.5–4.5)
Glucose: 91 mg/dL (ref 65–99)
POTASSIUM: 4.8 mmol/L (ref 3.5–5.2)
Sodium: 143 mmol/L (ref 134–144)
TOTAL PROTEIN: 7.5 g/dL (ref 6.0–8.5)

## 2017-12-04 LAB — LIPID PANEL
CHOL/HDL RATIO: 6.4 ratio — AB (ref 0.0–4.4)
Cholesterol, Total: 187 mg/dL (ref 100–199)
HDL: 29 mg/dL — AB (ref >39)
LDL CALC: 132 mg/dL — AB (ref 0–99)
TRIGLYCERIDES: 128 mg/dL (ref 0–149)
VLDL CHOLESTEROL CAL: 26 mg/dL (ref 5–40)

## 2017-12-04 LAB — VITAMIN D 25 HYDROXY (VIT D DEFICIENCY, FRACTURES): Vit D, 25-Hydroxy: 35.6 ng/mL (ref 30.0–100.0)

## 2017-12-04 LAB — TSH: TSH: 0.672 u[IU]/mL (ref 0.450–4.500)

## 2017-12-05 NOTE — Progress Notes (Signed)
Chief Complaint  Patient presents with  . Medicare Wellness    welcome to medicare.Wants to do pelvic here. She does have a cold today, been 10 day. Mucus is green in color. No fever. No other concerns.   . Immunizations    has a cold today and is unsure if she should take vaccines today.    Summer Norris is a 67 y.o. female who presents for Welcome to Medicare visit, and follow-up on chronic medical conditions.  She has had some cold symptoms for the last week. She thinks it is getting better overall, has some persistent congestion. The phlegm is still a little discolored, but she isn't getting much up. She has been taking Nyquil at bedtime, no other medications for symptoms. She has not had fevers. She is concerned about getting the recommended vaccines due to being sick, and also wanting to confirm with the [facility] folks that it is okay (she is involved in C.diff vaccine study), so declines getting them today.  She has had h/o elevated BP's in the past. She has been monitoring at home, ranges 111-132/61-78. She continues to limit sodium in her diet. She has been taking some Nyquil at night over the last week for URI symptoms, last taken last night.   Pre-diabetes--She cut back on sweets, carbs, sugar, had lost some weight. She had been going to the gym 2x/week, but not recently. She gained back a couple of pounds. Continues to watch her diet. Lab Results  Component Value Date   HGBA1C 5.7 (A) 09/06/2017   Smoker:  She is smoking 3-4 cigarettes daily (restarted after quitting vaping). Usually smokes just after a meal.  She quit for a year in the past with use of Chantix.  Osteoporosis: She was started on alendronate in 08/2015, after DEXA showed osteoporosis with T-3.0 at R fem neck. She had f/u DEXA in 10/2017 which showed T-2.5 at R fem neck, statistically significant increase in BMD. She is tolerating this without side effects. Denies hip or jaw pain.  Vitamin D deficiency: Level was  12 in 07/2015.  She was treated with 12 weeks of prescription vitamin D, and last check was normal at 31 in 02/2016.  Level was low again at 14.5 in 08/2017.  She was treated with another 12 weeks of 50,000 IU weekly rx vitamin D. She just recently completed the prescription is currently taking 1000 IU every day of OTC vitamin D3.  (previously took 5000 IU dose). She eats 3-4 cups of yogurt daily.   Immunization History  Administered Date(s) Administered  . Tdap 10/19/2011   declines flu shots, pneumonia shot in the past; declines today only bc of needing to check with study and due to illness; is now agreeable to these vaccines. Last Pap smear: N/A, s/p hysterectomy (for scar tissue, benign) Last mammogram: 10/2017 Last colonoscopy: Recalls having had one 2008 or 2009, reportedly normal (likely done at [facility], where she was a patient at the time) Last DEXA: 10/2017 T-2.5 at R fem neck (improved from T-3.0 in 08/2015) Dentist: regularly, until dentist retired (was in [place]) Ophtho: had cataract surgery and 1 year f/u, not since (has been a couple of years since she last went) Exercise: Walking/on her feet at work; lifts some heavy items at work (20-40#). Previously went to the gym 2x/week--treadmill and core exercises, hasn't been doing that recently.  Other doctors caring for patient include:  Dentist: None (had been going to [place]) Ophtho: Dr. Herbert Deaner GI: reports probably [facility]  Derm: Dr. Allyson Sabal (in past) Neuro:  Dr. Krista Blue (2013 for Bell's Palsy), Dr. Posey Pronto (did EMG/NCV in 2016) GYN: Dr. Gaetano Net (no longer sees)   Depression screen:  Negative Fall Screen: negative Function Status Survey: notable only for leakage of urine (with cough/sneeze) Mini-Cog screen: 4/5--time was correct on clock, but numbers were not spaced right (got all #'s written, too closely, finished #11 at the o'clock position).  See full screens in Epic   End of Life Discussion:  Patient does not have a living  will and medical power of attorney. Forms given.  Past Medical History:  Diagnosis Date  . Bell's palsy 03/10/2011   diagnosed in [place]. ongoing R sided facial weakness  . C7 radiculopathy 2016   right  . Carpal tunnel syndrome 2016   right  . Prediabetes   . Smoker     Past Surgical History:  Procedure Laterality Date  . ABDOMINAL HYSTERECTOMY    . CATARACT EXTRACTION Bilateral 08/2014 and 09/2014   Dr. Herbert Deaner  . TOTAL ABDOMINAL HYSTERECTOMY W/ BILATERAL SALPINGOOPHORECTOMY  1995    Social History   Socioeconomic History  . Marital status: Legally Separated    Spouse name: Not on file  . Number of children: Not on file  . Years of education: Not on file  . Highest education level: Not on file  Occupational History  . Occupation: parts Careers adviser: [employer]  . Financial resource strain: Not on file  . Food insecurity:    Worry: Not on file    Inability: Not on file  . Transportation needs:    Medical: Not on file    Non-medical: Not on file  Tobacco Use  . Smoking status: Current Every Day Smoker    Packs/day: 0.25    Years: 42.00    Pack years: 10.50    Types: E-cigarettes, Cigarettes  . Smokeless tobacco: Never Used  Substance and Sexual Activity  . Alcohol use: Yes    Alcohol/week: 0.0 standard drinks    Comment: 0-1 drinks per month, maybe.  . Drug use: No  . Sexual activity: Not Currently    Birth control/protection: Surgical  Lifestyle  . Physical activity:    Days per week: Not on file    Minutes per session: Not on file  . Stress: Not on file  Relationships  . Social connections:    Talks on phone: Not on file    Gets together: Not on file    Attends religious service: Not on file    Active member of club or organization: Not on file    Attends meetings of clubs or organizations: Not on file    Relationship status: Not on file  . Intimate partner violence:    Fear of current or ex partner: Not on  file    Emotionally abused: Not on file    Physically abused: Not on file    Forced sexual activity: Not on file  Other Topics Concern  . Not on file  Social History Narrative   Widowed and re-married, and then divorced. No children. Lives alone.   Works part-time    Family History  Problem Relation Age of Onset  . Diabetes Mother   . Heart disease Father   . Arthritis Sister   . Diabetes Sister        prediabetes  . Sarcoidosis Brother   . Heart disease Brother 51  . Lymphoma Brother 37  . Cancer  Brother 60       lymphoma  . Diabetes Sister        prediabetes    Outpatient Encounter Medications as of 12/06/2017  Medication Sig Note  . alendronate (FOSAMAX) 70 MG tablet TAKE 1 TABLET EVERY 7 DAYS WITH A FULL GLASS OF WATER AND ON EMPTY STOMACH   . cholecalciferol (VITAMIN D) 1000 UNITS tablet Take 5,000 Units by mouth daily. Reported on 09/09/2015 12/06/2017: Currently taking 1000 IU after recently finishing rx course  . Pseudoeph-Doxylamine-DM-APAP (NYQUIL PO) Take 2 each by mouth at bedtime. 12/06/2017: Taking at bedtime for recent illness  . ibuprofen (ADVIL,MOTRIN) 800 MG tablet Take 1 tablet (800 mg total) by mouth 3 (three) times daily. (Patient not taking: Reported on 12/06/2017)   . [DISCONTINUED] amoxicillin (AMOXIL) 875 MG tablet Take 1 tablet (875 mg total) by mouth 2 (two) times daily.   . [DISCONTINUED] ergocalciferol (VITAMIN D2) 50000 units capsule Take 1 capsule (50,000 Units total) by mouth once a week.    No facility-administered encounter medications on file as of 12/06/2017.     Allergies  Allergen Reactions  . Septra [Sulfamethoxazole-Trimethoprim]     Mouth itch    ROS: The patient denies anorexia, fever, headaches, decreased hearing, ear pain, sore throat, breast concerns, chest pain, palpitations, dizziness, syncope, dyspnea on exertion, swelling, nausea, vomiting, diarrhea, constipation, abdominal pain, melena, hematochezia, hematuria, dysuria, vaginal  bleeding, discharge, odor or itch, genital lesions, joint pains (intermittent neck and right shoulder pain, very rarely, lasts for a dayI), numbness, tingling, weakness, tremor, suspicious skin lesions, depression, anxiety, abnormal bleeding/bruising, or enlarged lymph nodes. No longer having any heartburn. Slight urinary leakage with cough URI symptoms x 1 week, improving.     PHYSICAL EXAM:  BP 130/78   Pulse 84   Temp 97.6 F (36.4 C) (Tympanic)   Ht 5' 0.25" (1.53 m)   Wt 154 lb 6.4 oz (70 kg)   BMI 29.90 kg/m   Wt Readings from Last 3 Encounters:  12/06/17 154 lb 6.4 oz (70 kg)  09/06/17 151 lb 12.8 oz (68.9 kg)  12/31/16 165 lb 6.4 oz (75 kg)    General Appearance:   Alert, cooperative, no distress, appears stated age  Head:   Normocephalic, without obvious abnormality, atraumatic. Right sided facial weakness noted, chronic/unchanged from prior visits.  Eyes:   PERRL, conjunctiva/corneas clear, EOM's intact; fundi benign  Ears:   nonobstructive cerumen noted on the left.  Visualized portion of TM and EAC normal. Normal TM and EAC on the right  Nose:  Nares normal, mucosa is mild-moderately edematous; no erythema or purulence, no sinustenderness  Throat:  Lips, mucosa, and tongue normal; Tooth decay noted. Mucosa appears normal  Neck:  Supple, no lymphadenopathy; thyroid: noenlargement/ tenderness/nodules; no carotid bruit or JVD  Back:  Spine nontender, no curvature, ROM normal, no CVA tenderness  Lungs:   Clear to auscultation bilaterally without wheezes, rales or ronchi; respirations unlabored. Some ronchi noted on initial inspiration, cleared with cough.  Chest Wall:   No tenderness or deformity  Heart:   Regular rate and rhythm, S1 and S2 normal, no murmur, rub  or gallop  Breast Exam:   No tenderness, masses, or nipple discharge or inversion. No axillary lymphadenopathy  Abdomen:   Soft, non-tender, nondistended,  normoactive bowel sounds,   no masses, no hepatosplenomegaly. WHSS  Genitalia:   Normal external genitalia without lesions. BUS and vagina normal; uterus surgically absent. No abnormal vaginal discharge. No adnexal masses. Pap not performed  Rectal:   Normal tone, no masses or tenderness; stools is trace guaiac positive.  Extremities:  No clubbing, cyanosis or edema  Pulses:  2+ and symmetric all extremities  Skin:  Skin color, texture, turgor normal, no rashes or lesions  Lymph nodes:  Cervical, supraclavicular, and axillary nodes normal  Neurologic:  CNII-XII intact except some facial asymmetry due to chronic right-sided weakness from prior Bell's palsy; normal strength, sensation and gait; reflexes 2+ and symmetric throughout   Psych: Normal mood, affect, hygiene and grooming   Lab Results  Component Value Date   CHOL 187 12/03/2017   HDL 29 (L) 12/03/2017   LDLCALC 132 (H) 12/03/2017   TRIG 128 12/03/2017   CHOLHDL 6.4 (H) 12/03/2017   Lab Results  Component Value Date   WBC 9.0 12/03/2017   HGB 14.7 12/03/2017   HCT 44.9 12/03/2017   MCV 86 12/03/2017   PLT 410 12/03/2017   Lab Results  Component Value Date   TSH 0.672 12/03/2017     Chemistry      Component Value Date/Time   NA 143 12/03/2017 0837   K 4.8 12/03/2017 0837   CL 103 12/03/2017 0837   CO2 21 12/03/2017 0837   BUN 9 12/03/2017 0837   CREATININE 0.90 12/03/2017 0837   CREATININE 0.84 08/14/2015 0847      Component Value Date/Time   CALCIUM 9.8 12/03/2017 0837   ALKPHOS 84 12/03/2017 0837   AST 15 12/03/2017 0837   ALT 11 12/03/2017 0837   BILITOT 0.7 12/03/2017 0837     Fasting sugar: 91 Vitamin D-OH 35.6   ASSESSMENT/PLAN:  Welcome to Medicare preventive visit - Plan: Visual acuity screening, POCT Urinalysis DIP (Proadvantage Device)  Osteoporosis without current pathological fracture, unspecified osteoporosis type - continue alendronate.  Counseled re: Calcium, D, weight-bearing exercise and quitting smoking  Vitamin D deficiency - recheck after recent rx normal; continue daily supplement. Recheck in 6 mos--Rec increasing dose to 2000 IU - Plan: VITAMIN D 25 Hydroxy (Vit-D Deficiency, Fractures)  Prediabetes - counseled re: diet, exercise, weight loss, and risk for DM - Plan: Hemoglobin A1c, Glucose, random  Tobacco use - counseled re: cessation; only after meals, needs to break habit. Risks reviewed  Colon cancer screening - Discussed colonoscopy vs Cologuard. She doesn't have anyone to take her. Tr heme +today; to wait a couple of weeks before submitting Cologuard - Plan: Cologuard  Dyslipidemia - Plan: Lipid panel   Had EKG 12/2016, not needed today  Instructed on how to perform Kegels exercises.  Discussed monthly self breast exams and yearly mammograms; at least 30 minutes of aerobic activity at least 5 days/week, weight-bearing exercise at least 2x/wk; proper sunscreen use reviewed; healthy diet, including goals of calcium and vitamin D intake and alcohol recommendations (less than or equal to 1 drink/day) reviewed; regular seatbelt use; changing batteries in smoke detectors. Immunization recommendations discussed, due for yearly flu shot (high dose) and Prevnar-13. She declined these today due to illness and wanting to check with [facility] due to the vaccine study she is in. Will schedule NV once she gets okay. Shingrix is also recommended--discussed risks/SE, and she should get from pharmacy. Colon cancer screening recommendations reviewed, past due. Discussed Colonoscopy vs Cologuard.  Prefers Cologuard.  Recommend waiting a couple of weeks due to trace heme + stool (in case from trauma, or hemorrhoid; she denies bowel changes or seeing blood; her recent CBC was normal).  We did discuss that heme + blood is enough to make  the Cologuard abnormal, as reason for holding off, but that if she still has ongoing blood in her  stool, she will need colonoscopy.  She prefers to start with Cologuard testing first, rather than repeating colonoscopy (issues with who brings her/stays with her for test). Does understand and agrees to colonoscopy if Cologuard test is abnormal.  Given paperwork for Living Will and healthcare power of attorney. MOST form discussed and filled out.   Full Code, Full Care.  F/u 6 mos with labs prior Will need lipids, A1c, glu, vit D   Your HDL (good cholesterol) was very low, which is bad.  Daily exercise, quitting smoking and possibly taking omega-3 fish oil can all help raise the HDL.   Your LDL (bad kind) was also higher than in the past. Please read the info provided to cut back on cholesterol in your diet. We will recheck in 6 months.   Medicare Attestation I have personally reviewed: The patient's medical and social history Their use of alcohol, tobacco or illicit drugs Their current medications and supplements The patient's functional ability including ADLs,fall risks, home safety risks, cognitive, and hearing and visual impairment Diet and physical activities Evidence for depression or mood disorders  The patient's weight, height and BMI have been recorded in the chart.  I have made referrals, counseling, and provided education to the patient based on review of the above and I have provided the patient with a written personalized care plan for preventive services.

## 2017-12-05 NOTE — Patient Instructions (Addendum)
HEALTH MAINTENANCE RECOMMENDATIONS:  It is recommended that you get at least 30 minutes of aerobic exercise at least 5 days/week (for weight loss, you may need as much as 60-90 minutes). This can be any activity that gets your heart rate up. This can be divided in 10-15 minute intervals if needed, but try and build up your endurance at least once a week.  Weight bearing exercise is also recommended twice weekly.  Eat a healthy diet with lots of vegetables, fruits and fiber.  "Colorful" foods have a lot of vitamins (ie green vegetables, tomatoes, red peppers, etc).  Limit sweet tea, regular sodas and alcoholic beverages, all of which has a lot of calories and sugar.  Up to 1 alcoholic drink daily may be beneficial for women (unless trying to lose weight, watch sugars).  Drink a lot of water.  Calcium recommendations are 1200-1500 mg daily (1500 mg for postmenopausal women or women without ovaries), and vitamin D 1000 IU daily.  This should be obtained from diet and/or supplements (vitamins), and calcium should not be taken all at once, but in divided doses. Your vitamin D level may need to be higher than the general recommendation, if levels continue to be low.   Monthly self breast exams and yearly mammograms for women over the age of 62 is recommended.  Sunscreen of at least SPF 30 should be used on all sun-exposed parts of the skin when outside between the hours of 10 am and 4 pm (not just when at beach or pool, but even with exercise, golf, tennis, and yard work!)  Use a sunscreen that says "broad spectrum" so it covers both UVA and UVB rays, and make sure to reapply every 1-2 hours.  Remember to change the batteries in your smoke detectors when changing your clock times in the spring and fall.  Use your seat belt every time you are in a car, and please drive safely and not be distracted with cell phones and texting while driving.    Summer Norris , Thank you for taking time to come for your  Welcome to Medicare Visit. I appreciate your ongoing commitment to your health goals. Please review the following plan we discussed and let me know if I can assist you in the future.   This is a list of the screening recommended for you and due dates:  Health Maintenance  Topic Date Due  . HIV Screening  06/01/1967  . Pap Smear  05/31/1973  . Colon Cancer Screening  06/01/2002  . Pneumonia vaccines (1 of 2 - PCV13) 05/31/2017  . Flu Shot  10/14/2017  . Mammogram  10/20/2019  . Tetanus Vaccine  10/18/2021  . DEXA scan (bone density measurement)  Completed  .  Hepatitis C: One time screening is recommended by Center for Disease Control  (CDC) for  adults born from [birth year] through 1963.   Completed   You no longer need pap smears (ignore that date). Colon cancer screening is due. We discussed colonsocopy and Cologuard testing. You opted for the latter, so we will do the referral. Expect a box delivered to your home.  If your test is abnormal, you will need to have a colonoscopy. If it is normal, then we can repeat the Cologuard testing in 3 years.  Mammograms are recommended yearly, next due 10/2018 (not 10/2019 as stated above).  Continue yearly mammograms, as you have been doin.  Next bone density test will be due 10/2018 (2 years from the last).  Continue  the alendronate, which is working to keep your bones strong.  Ensuring that you continue to get enough calcium (from diet and supplements) and vitamin D (from supplements), weight bearing exercise and quitting smoking will all help keep your bones strong.  Yearly flu shots are recommended (now you get the high dose kind, being 65 or over.) Return when you have clearance from the [employer] folks, and when your illness has resolved (call for a nurse visit).  Martha Clan is recommended today, and pneumovax will be given next year (both are vaccinations against pneumococcus, which can cause serious illnesses, including pneumonia).  Return when you  are well (recovered from illness) and when cleared from the study (nurse visit).  I recommend getting the new shingles vaccine (Shingrix). You will need to check with your insurance to see if it is covered, and if covered by Medicare Part D, you need to get from the pharmacy rather than our office.  It is a series of 2 injections, spaced 2 months apart.  Please try and quit smoking--start thinking about why/when you smoke (habit, boredom, stress) in order to come up with effective strategies to cut back or quit. Available resources to help you quit include free counseling through [facility] Quitline ([facility]Quitline.com or 1-800-QUITNOW), smoking cessation classes through [facility] (call to find out schedule), over-the-counter nicotine replacements, and e-cigarettes (although this may not help break the hand-mouth habit).  Many insurance companies also have smoking cessation programs (which may decrease the cost of patches, meds if enrolled).  If these methods are not effective for you, and you are motivated to quit, return to discuss the possibility of prescription medications.  Please find a dentist and go for routine dental cleanings (they also screen for oral cancers, which is important since you're a smoker).  Please schedule a routine eye exam.  Increase your vitamin D to 2000 IU of vitamin D 3 until we check it next time.  Your HDL (good cholesterol) was very low, which is bad.  Daily exercise, quitting smoking and possibly taking omega-3 fish oil can all help raise the HDL.   Your LDL (bad kind) was also higher than in the past. Please read the info provided to cut back on cholesterol in your diet. We will recheck in 6 months.  Lab Results  Component Value Date   CHOL 187 12/03/2017   HDL 29 (L) 12/03/2017   LDLCALC 132 (H) 12/03/2017   TRIG 128 12/03/2017   CHOLHDL 6.4 (H) 12/03/2017     Fat and Cholesterol Restricted Diet Getting too much fat and cholesterol in your  diet may cause health problems. Following this diet helps keep your fat and cholesterol at normal levels. This can keep you from getting sick. What types of fat should I choose?  Choose monosaturated and polyunsaturated fats. These are found in foods such as olive oil, canola oil, flaxseeds, walnuts, almonds, and seeds.  Eat more omega-3 fats. Good choices include salmon, mackerel, sardines, tuna, flaxseed oil, and ground flaxseeds.  Limit saturated fats. These are in animal products such as meats, butter, and cream. They can also be in plant products such as palm oil, palm kernel oil, and coconut oil.  Avoid foods with partially hydrogenated oils in them. These contain trans fats. Examples of foods that have trans fats are stick margarine, some tub margarines, cookies, crackers, and other baked goods. What general guidelines do I need to follow?  Check food labels. Look for the words "trans fat" and "  saturated fat."  When preparing a meal: ? Fill half of your plate with vegetables and green salads. ? Fill one fourth of your plate with whole grains. Look for the word "whole" as the first word in the ingredient list. ? Fill one fourth of your plate with lean protein foods.  Eat more foods that have fiber, like apples, carrots, beans, peas, and barley.  Eat more home-cooked foods. Eat less at restaurants and buffets.  Limit or avoid alcohol.  Limit foods high in starch and sugar.  Limit fried foods.  Cook foods without frying them. Baking, boiling, grilling, and broiling are all great options.  Lose weight if you are overweight. Losing even a small amount of weight can help your overall health. It can also help prevent diseases such as diabetes and heart disease. What foods can I eat? Grains Whole grains, such as whole wheat or whole grain breads, crackers, cereals, and pasta. Unsweetened oatmeal, bulgur, barley, quinoa, or brown rice. Corn or whole wheat flour  tortillas. Vegetables Fresh or frozen vegetables (raw, steamed, roasted, or grilled). Green salads. Fruits All fresh, canned (in natural juice), or frozen fruits. Meat and Other Protein Products Ground beef (85% or leaner), grass-fed beef, or beef trimmed of fat. Skinless chicken or [place]. Ground chicken or [place]. Pork trimmed of fat. All fish and seafood. Eggs. Dried beans, peas, or lentils. Unsalted nuts or seeds. Unsalted canned or dry beans. Dairy Low-fat dairy products, such as skim or 1% milk, 2% or reduced-fat cheeses, low-fat ricotta or cottage cheese, or plain low-fat yogurt. Fats and Oils Tub margarines without trans fats. Light or reduced-fat mayonnaise and salad dressings. Avocado. Olive, canola, sesame, or safflower oils. Natural peanut or almond butter (choose ones without added sugar and oil). The items listed above may not be a complete list of recommended foods or beverages. Contact your dietitian for more options. What foods are not recommended? Grains White bread. White pasta. White rice. Cornbread. Bagels, pastries, and croissants. Crackers that contain trans fat. Vegetables White potatoes. Corn. Creamed or fried vegetables. Vegetables in a cheese sauce. Fruits Dried fruits. Canned fruit in light or heavy syrup. Fruit juice. Meat and Other Protein Products Fatty cuts of meat. Ribs, chicken wings, bacon, sausage, bologna, salami, chitterlings, fatback, hot dogs, bratwurst, and packaged luncheon meats. Liver and organ meats. Dairy Whole or 2% milk, cream, half-and-half, and cream cheese. Whole milk cheeses. Whole-fat or sweetened yogurt. Full-fat cheeses. Nondairy creamers and whipped toppings. Processed cheese, cheese spreads, or cheese curds. Sweets and Desserts Corn syrup, sugars, honey, and molasses. Candy. Jam and jelly. Syrup. Sweetened cereals. Cookies, pies, cakes, donuts, muffins, and ice cream. Fats and Oils Butter, stick margarine, lard, shortening, ghee, or  bacon fat. Coconut, palm kernel, or palm oils. Beverages Alcohol. Sweetened drinks (such as sodas, lemonade, and fruit drinks or punches). The items listed above may not be a complete list of foods and beverages to avoid. Contact your dietitian for more information. This information is not intended to replace advice given to you by your health care provider. Make sure you discuss any questions you have with your health care provider. Document Released: 09/01/2011 Document Revised: 11/07/2015 Document Reviewed: 06/01/2013 Elsevier Interactive Patient Education  [employer].

## 2017-12-06 ENCOUNTER — Ambulatory Visit (INDEPENDENT_AMBULATORY_CARE_PROVIDER_SITE_OTHER): Payer: Medicare HMO | Admitting: Family Medicine

## 2017-12-06 ENCOUNTER — Ambulatory Visit: Payer: Medicare HMO | Admitting: Family Medicine

## 2017-12-06 ENCOUNTER — Encounter: Payer: Self-pay | Admitting: Family Medicine

## 2017-12-06 VITALS — BP 130/78 | HR 84 | Temp 97.6°F | Ht 60.25 in | Wt 154.4 lb

## 2017-12-06 DIAGNOSIS — E559 Vitamin D deficiency, unspecified: Secondary | ICD-10-CM | POA: Diagnosis not present

## 2017-12-06 DIAGNOSIS — E785 Hyperlipidemia, unspecified: Secondary | ICD-10-CM | POA: Diagnosis not present

## 2017-12-06 DIAGNOSIS — M810 Age-related osteoporosis without current pathological fracture: Secondary | ICD-10-CM

## 2017-12-06 DIAGNOSIS — Z720 Tobacco use: Secondary | ICD-10-CM

## 2017-12-06 DIAGNOSIS — R7303 Prediabetes: Secondary | ICD-10-CM | POA: Diagnosis not present

## 2017-12-06 DIAGNOSIS — Z0000 Encounter for general adult medical examination without abnormal findings: Secondary | ICD-10-CM | POA: Diagnosis not present

## 2017-12-06 DIAGNOSIS — Z1211 Encounter for screening for malignant neoplasm of colon: Secondary | ICD-10-CM | POA: Diagnosis not present

## 2017-12-06 LAB — POCT URINALYSIS DIP (PROADVANTAGE DEVICE)
Bilirubin, UA: NEGATIVE
Blood, UA: NEGATIVE
GLUCOSE UA: NEGATIVE mg/dL
Ketones, POC UA: NEGATIVE mg/dL
Leukocytes, UA: NEGATIVE
Nitrite, UA: NEGATIVE
Protein Ur, POC: 30 mg/dL — AB
Specific Gravity, Urine: 1.03
UUROB: NEGATIVE
pH, UA: 5.5 (ref 5.0–8.0)

## 2017-12-07 ENCOUNTER — Encounter: Payer: Self-pay | Admitting: Family Medicine

## 2017-12-14 ENCOUNTER — Encounter: Payer: Self-pay | Admitting: *Deleted

## 2017-12-15 NOTE — Progress Notes (Signed)
cologuard referral sent

## 2017-12-20 NOTE — Progress Notes (Signed)
Nope.

## 2017-12-21 ENCOUNTER — Encounter: Payer: Self-pay | Admitting: Gastroenterology

## 2017-12-21 ENCOUNTER — Other Ambulatory Visit: Payer: Self-pay | Admitting: *Deleted

## 2017-12-21 DIAGNOSIS — Z1211 Encounter for screening for malignant neoplasm of colon: Secondary | ICD-10-CM

## 2017-12-24 ENCOUNTER — Other Ambulatory Visit (INDEPENDENT_AMBULATORY_CARE_PROVIDER_SITE_OTHER): Payer: Medicare HMO

## 2017-12-24 DIAGNOSIS — Z23 Encounter for immunization: Secondary | ICD-10-CM

## 2017-12-27 ENCOUNTER — Other Ambulatory Visit: Payer: Medicare HMO

## 2018-01-21 ENCOUNTER — Ambulatory Visit (AMBULATORY_SURGERY_CENTER): Payer: Self-pay | Admitting: *Deleted

## 2018-01-21 ENCOUNTER — Encounter: Payer: Self-pay | Admitting: Gastroenterology

## 2018-01-21 ENCOUNTER — Telehealth: Payer: Self-pay | Admitting: Gastroenterology

## 2018-01-21 VITALS — Ht 60.0 in | Wt 155.0 lb

## 2018-01-21 DIAGNOSIS — Z1211 Encounter for screening for malignant neoplasm of colon: Secondary | ICD-10-CM

## 2018-01-21 MED ORDER — NA SULFATE-K SULFATE-MG SULF 17.5-3.13-1.6 GM/177ML PO SOLN: 1.0000 | Freq: Once | ORAL | 0 refills | Status: AC

## 2018-01-21 NOTE — Telephone Encounter (Signed)
Prep is $100- pt cannot afford --  Procedure is 11-11 Monday- will give sample  Lot [id number]   10/21 as dorected- pt will pick up today bafore 5 pm 3rd floor desk  Lelan Pons pV

## 2018-01-21 NOTE — Progress Notes (Signed)
No egg or soy allergy known to patient  No issues with past sedation with any surgeries  or procedures, no intubation problems  No diet pills per patient No home 02 use per patient  No blood thinners per patient  Pt denies issues with constipation  No A fib or A flutter  EMMI video sent to pt's e mail pt declined   

## 2018-01-21 NOTE — Telephone Encounter (Signed)
Patient states that prep is too expensive would like something cheaper.

## 2018-01-22 ENCOUNTER — Other Ambulatory Visit: Payer: Self-pay | Admitting: Family Medicine

## 2018-01-24 ENCOUNTER — Ambulatory Visit (AMBULATORY_SURGERY_CENTER): Payer: Medicare HMO | Admitting: Gastroenterology

## 2018-01-24 ENCOUNTER — Encounter: Payer: Self-pay | Admitting: Gastroenterology

## 2018-01-24 VITALS — BP 137/68 | HR 51 | Temp 97.3°F | Resp 13 | Ht 60.0 in | Wt 155.0 lb

## 2018-01-24 DIAGNOSIS — D124 Benign neoplasm of descending colon: Secondary | ICD-10-CM | POA: Diagnosis not present

## 2018-01-24 DIAGNOSIS — D123 Benign neoplasm of transverse colon: Secondary | ICD-10-CM | POA: Diagnosis not present

## 2018-01-24 DIAGNOSIS — Z1211 Encounter for screening for malignant neoplasm of colon: Secondary | ICD-10-CM

## 2018-01-24 DIAGNOSIS — D125 Benign neoplasm of sigmoid colon: Secondary | ICD-10-CM

## 2018-01-24 DIAGNOSIS — D128 Benign neoplasm of rectum: Secondary | ICD-10-CM

## 2018-01-24 DIAGNOSIS — D122 Benign neoplasm of ascending colon: Secondary | ICD-10-CM | POA: Diagnosis not present

## 2018-01-24 DIAGNOSIS — D127 Benign neoplasm of rectosigmoid junction: Secondary | ICD-10-CM | POA: Diagnosis not present

## 2018-01-24 DIAGNOSIS — E669 Obesity, unspecified: Secondary | ICD-10-CM | POA: Diagnosis not present

## 2018-01-24 DIAGNOSIS — D129 Benign neoplasm of anus and anal canal: Secondary | ICD-10-CM

## 2018-01-24 MED ORDER — SODIUM CHLORIDE 0.9 % IV SOLN: 500.0000 mL | Freq: Once | INTRAVENOUS | Status: DC

## 2018-01-24 NOTE — Progress Notes (Signed)
To recovery, report to RN, VSS. 

## 2018-01-24 NOTE — Progress Notes (Signed)
I have reviewed the patient's medical history in detail and updated the computerized patient record.

## 2018-01-24 NOTE — Progress Notes (Signed)
Called to room to assist during endoscopic procedure.  Patient ID and intended procedure confirmed with present staff. Received instructions for my participation in the procedure from the performing physician.  

## 2018-01-24 NOTE — Patient Instructions (Signed)
Discharge instructions given. Handouts on polyps,diverticulosis and hemorrhoids. Resume previous medications. YOU HAD AN ENDOSCOPIC PROCEDURE TODAY AT THE [facility]:   Refer to the procedure report that was given to you for any specific questions about what was found during the examination.  If the procedure report does not answer your questions, please call your gastroenterologist to clarify.  If you requested that your care partner not be given the details of your procedure findings, then the procedure report has been included in a sealed envelope for you to review at your convenience later.  YOU SHOULD EXPECT: Some feelings of bloating in the abdomen. Passage of more gas than usual.  Walking can help get rid of the air that was put into your GI tract during the procedure and reduce the bloating. If you had a lower endoscopy (such as a colonoscopy or flexible sigmoidoscopy) you may notice spotting of blood in your stool or on the toilet paper. If you underwent a bowel prep for your procedure, you may not have a normal bowel movement for a few days.  Please Note:  You might notice some irritation and congestion in your nose or some drainage.  This is from the oxygen used during your procedure.  There is no need for concern and it should clear up in a day or so.  SYMPTOMS TO REPORT IMMEDIATELY:   Following lower endoscopy (colonoscopy or flexible sigmoidoscopy):  Excessive amounts of blood in the stool  Significant tenderness or worsening of abdominal pains  Swelling of the abdomen that is new, acute  Fever of 100F or higher   For urgent or emergent issues, a gastroenterologist can be reached at any hour by calling [phone].   DIET:  We do recommend a small meal at first, but then you may proceed to your regular diet.  Drink plenty of fluids but you should avoid alcoholic beverages for 24 hours.  ACTIVITY:  You should plan to take it easy for the rest of today and you  should NOT DRIVE or use heavy machinery until tomorrow (because of the sedation medicines used during the test).    FOLLOW UP: Our staff will call the number listed on your records the next business day following your procedure to check on you and address any questions or concerns that you may have regarding the information given to you following your procedure. If we do not reach you, we will leave a message.  However, if you are feeling well and you are not experiencing any problems, there is no need to return our call.  We will assume that you have returned to your regular daily activities without incident.  If any biopsies were taken you will be contacted by phone or by letter within the next 1-3 weeks.  Please call us at [phone] if you have not heard about the biopsies in 3 weeks.    SIGNATURES/CONFIDENTIALITY: You and/or your care partner have signed paperwork which will be entered into your electronic medical record.  These signatures attest to the fact that that the information above on your After Visit Summary has been reviewed and is understood.  Full responsibility of the confidentiality of this discharge information lies with you and/or your care-partner. 

## 2018-01-24 NOTE — Op Note (Signed)
[facility] Patient Name: Summer Norris Procedure Date: 01/24/2018 7:59 AM MRN: [id number] Endoscopist: Mauri Pole , MD Age: 67 Referring MD:  Date of Birth: 04/21/1950 Gender: Female Account #: [id number] Procedure:                Colonoscopy Indications:              Screening for colorectal malignant neoplasm, Last                            colonoscopy: date unknown (unable to locate last                            colonoscopy report) Medicines:                Monitored Anesthesia Care Procedure:                Pre-Anesthesia Assessment:                           - Prior to the procedure, a History and Physical                            was performed, and patient medications and                            allergies were reviewed. The patient's tolerance of                            previous anesthesia was also reviewed. The risks                            and benefits of the procedure and the sedation                            options and risks were discussed with the patient.                            All questions were answered, and informed consent                            was obtained. Prior Anticoagulants: The patient has                            taken no previous anticoagulant or antiplatelet                            agents. ASA Grade Assessment: II - A patient with                            mild systemic disease. After reviewing the risks                            and benefits, the patient was deemed in  satisfactory condition to undergo the procedure.                           After obtaining informed consent, the colonoscope                            was passed under direct vision. Throughout the                            procedure, the patient's blood pressure, pulse, and                            oxygen saturations were monitored continuously. The                            Colonoscope was introduced through  the anus and                            advanced to the the cecum, identified by                            appendiceal orifice and ileocecal valve. The                            colonoscopy was performed without difficulty. The                            patient tolerated the procedure well. The quality                            of the bowel preparation was good. The ileocecal                            valve, appendiceal orifice, and rectum were                            photographed. Scope In: 8:02:16 AM Scope Out: 8:28:17 AM Scope Withdrawal Time: 0 hours 16 minutes 16 seconds  Total Procedure Duration: 0 hours 26 minutes 1 second  Findings:                 The digital rectal exam findings include decreased                            sphincter tone.                           A 2 mm polyp was found in the ascending colon. The                            polyp was sessile. The polyp was removed with a                            cold biopsy forceps. Resection and retrieval were  complete.                           A 15 mm polyp was found in the transverse colon.                            The polyp was semi-pedunculated. The polyp was                            removed with a hot snare. Resection and retrieval                            were complete.                           Six sessile polyps were found in the rectum,                            sigmoid colon, descending colon and transverse                            colon. The polyps were 4 to 8 mm in size. These                            polyps were removed with a cold snare. Resection                            and retrieval were complete.                           A few small-mouthed diverticula were found in the                            sigmoid colon.                           Non-bleeding internal hemorrhoids were found during                            retroflexion. The hemorrhoids were small.                            A single small localized angioectasia without                            bleeding was found in the ascending colon. Complications:            No immediate complications. Estimated Blood Loss:     Estimated blood loss was minimal. Impression:               - Decreased sphincter tone found on digital rectal                            exam.                           -  One 2 mm polyp in the ascending colon, removed                            with a cold biopsy forceps. Resected and retrieved.                           - One 15 mm polyp in the transverse colon, removed                            with a hot snare. Resected and retrieved.                           - Six 4 to 8 mm polyps in the rectum, in the                            sigmoid colon, in the descending colon and in the                            transverse colon, removed with a cold snare.                            Resected and retrieved.                           - Diverticulosis in the sigmoid colon.                           - Non-bleeding internal hemorrhoids.                           - A single non-bleeding colonic angioectasia. Recommendation:           - Patient has a contact number available for                            emergencies. The signs and symptoms of potential                            delayed complications were discussed with the                            patient. Return to normal activities tomorrow.                            Written discharge instructions were provided to the                            patient.                           - Resume previous diet.                           - Continue present medications.                           -  Await pathology results.                           - Repeat colonoscopy in 3 years for surveillance                            based on pathology results. Mauri Pole, MD 01/24/2018 8:35:06 AM This report has been signed  electronically.

## 2018-01-25 ENCOUNTER — Telehealth: Payer: Self-pay | Admitting: *Deleted

## 2018-01-25 NOTE — Telephone Encounter (Signed)
  Follow up Call-  Call back number 01/24/2018  Post procedure Call Back phone  # [phone]  Permission to leave phone message Yes  Some recent data might be hidden     Patient questions:  Do you have a fever, pain , or abdominal swelling? No. Pain Score  0 *  Have you tolerated food without any problems? Yes.    Have you been able to return to your normal activities? Yes.    Do you have any questions about your discharge instructions: Diet   No. Medications  No. Follow up visit  No.  Do you have questions or concerns about your Care? No.  Actions: * If pain score is 4 or above: No action needed, pain <4.

## 2018-01-25 NOTE — Telephone Encounter (Signed)
  Follow up Call-  Call back number 01/24/2018  Post procedure Call Back phone  # [phone]  Permission to leave phone message Yes  Some recent data might be hidden     Message left to call us if necessary.

## 2018-01-31 ENCOUNTER — Encounter: Payer: Medicare HMO | Admitting: Gastroenterology

## 2018-01-31 ENCOUNTER — Encounter: Payer: Self-pay | Admitting: Gastroenterology

## 2018-03-21 ENCOUNTER — Encounter: Payer: Self-pay | Admitting: Family Medicine

## 2018-03-21 ENCOUNTER — Ambulatory Visit (INDEPENDENT_AMBULATORY_CARE_PROVIDER_SITE_OTHER): Payer: Medicare HMO | Admitting: Family Medicine

## 2018-03-21 VITALS — BP 122/68 | HR 80 | Temp 98.6°F | Ht 60.25 in | Wt 155.8 lb

## 2018-03-21 DIAGNOSIS — H9203 Otalgia, bilateral: Secondary | ICD-10-CM

## 2018-03-21 DIAGNOSIS — Z720 Tobacco use: Secondary | ICD-10-CM

## 2018-03-21 DIAGNOSIS — H6123 Impacted cerumen, bilateral: Secondary | ICD-10-CM | POA: Diagnosis not present

## 2018-03-21 NOTE — Patient Instructions (Addendum)
   Your ears are partly blocked with wax, which may be contributing to your symptoms. You did also have some nasal congestion and mucus, and eustachian tube dysfunction (from the congestion) can also contribute to plugging of the ears. We are cleaning the wax out. If you have persistent plugging of the ears, try using sudafed (decongestant) to see if that helps. After the ears were cleaned, there was some fluid noted in the left ear, but it isn't infected. If you develop fever, swollen glands, or increasing ear pain, please return for re-evaluation.   Please try and quit smoking--start thinking about why/when you smoke (habit, boredom, stress) in order to come up with effective strategies to cut back or quit. Available resources to help you quit include free counseling through [facility] Quitline ([place]Quitline.com or 1-800-QUITNOW), smoking cessation classes through [facility] (call to find out schedule), over-the-counter nicotine replacements, and e-cigarettes (although this may not help break the hand-mouth habit).  Many insurance companies also have smoking cessation programs (which may decrease the cost of patches, meds if enrolled).  If these methods are not effective for you, and you are motivated to quit, return to discuss the possibility of prescription medications.

## 2018-03-21 NOTE — Progress Notes (Signed)
Chief Complaint  Patient presents with  . Ear Pain    B/L ear pain x 1 week. Did use drop per pharmacist, helped some but not completely.    Both ears feel full. She had some sharp pains in the right ear last week.  OTC ear drops helped with the pain, but today both ears bothered her.  Some decreased hearing/plugging in both ears.  Denies cold or allergy symptoms.   No fever/chills.  +smoking  PMH, PSH, SH reviewed  Outpatient Encounter Medications as of 03/21/2018  Medication Sig Note  . alendronate (FOSAMAX) 70 MG tablet TAKE 1 TABLET EVERY 7 DAYS WITH A FULL GLASS OF WATER AND ON EMPTY STOMACH   . cholecalciferol (VITAMIN D) 1000 UNITS tablet Take 5,000 Units by mouth daily. Reported on 09/09/2015 12/06/2017: Currently taking 1000 IU after recently finishing rx course  . Omega-3 Fatty Acids (FISH OIL) 1000 MG CAPS Take 1 capsule by mouth daily.   Marland Kitchen ibuprofen (ADVIL,MOTRIN) 800 MG tablet Take 1 tablet (800 mg total) by mouth 3 (three) times daily. (Patient not taking: Reported on 03/21/2018)   . [DISCONTINUED] Pseudoeph-Doxylamine-DM-APAP (NYQUIL PO) Take 2 each by mouth at bedtime. 12/06/2017: Taking at bedtime for recent illness   No facility-administered encounter medications on file as of 03/21/2018.    Allergies  Allergen Reactions  . Septra [Sulfamethoxazole-Trimethoprim]     Mouth itch    ROS: no fever, chills, nausea, vomiting, diarrhea, URI symptoms.  Ear complaints per HPI   PHYSICAL EXAM:  BP 122/68   Pulse 80   Temp 98.6 F (37 C) (Tympanic)   Ht 5' 0.25" (1.53 m)   Wt 155 lb 12.8 oz (70.7 kg)   BMI 30.18 kg/m   Well appearing, pleasant female, smells of tobacco smoke. In no distress HEENT: conjunctiva and sclera are clear, EOMI. TM's-- Partially occluded by cerumen bilateraly.  After lavage--left TM visualized, notable for effusion ,no erythema. Canals clear.  R had some persistent deep ear wax.  Visualized portion of TM was normal, no erythema. Nasal mucosa is  mod edematous with clear mucus. Sinuses are nontender. OP is clear Neck: no lymphadenopathy or mass Heart: regular rate and rhythm Lungs: clear bilaterally   ASSESSMENT/PLAN:  Ear pain, bilateral - suspect ETD as a cause--there is nasal congestion, mild effusion. Some improvement in discomfort/hearing after lavage  Bilateral impacted cerumen - partial relief of symptoms (L>R) s/p lavage  Tobacco use - cessation encouraged  Advised to use sudafed prn, along with drops to help dissolve the remaining ear wax.   Your ears are partly blocked with wax, which may be contributing to your symptoms. You did also have some nasal congestion and mucus, and eustachian tube dysfunction (from the congestion) can also contribute to plugging of the ears. We are cleaning the wax out. If you have persistent plugging of the ears, try using sudafed (decongestant) to see if that helps.  After the ears were cleaned, there was some fluid noted in the left ear, but it isn't infected. If you develop fever, swollen glands, or increasing ear pain, please return for re-evaluation.

## 2018-05-27 ENCOUNTER — Other Ambulatory Visit: Payer: Self-pay

## 2018-05-27 ENCOUNTER — Other Ambulatory Visit: Payer: Medicare HMO

## 2018-05-27 DIAGNOSIS — R7303 Prediabetes: Secondary | ICD-10-CM

## 2018-05-27 DIAGNOSIS — E785 Hyperlipidemia, unspecified: Secondary | ICD-10-CM | POA: Diagnosis not present

## 2018-05-27 DIAGNOSIS — E559 Vitamin D deficiency, unspecified: Secondary | ICD-10-CM

## 2018-05-28 LAB — LIPID PANEL
CHOL/HDL RATIO: 5.6 ratio — AB (ref 0.0–4.4)
Cholesterol, Total: 196 mg/dL (ref 100–199)
HDL: 35 mg/dL — AB (ref >39)
LDL Calculated: 131 mg/dL — ABNORMAL HIGH (ref 0–99)
Triglycerides: 152 mg/dL — ABNORMAL HIGH (ref 0–149)
VLDL Cholesterol Cal: 30 mg/dL (ref 5–40)

## 2018-05-28 LAB — VITAMIN D 25 HYDROXY (VIT D DEFICIENCY, FRACTURES): Vit D, 25-Hydroxy: 16 ng/mL — ABNORMAL LOW (ref 30.0–100.0)

## 2018-05-28 LAB — GLUCOSE, RANDOM: Glucose: 92 mg/dL (ref 65–99)

## 2018-05-28 LAB — HEMOGLOBIN A1C
Est. average glucose Bld gHb Est-mCnc: 123 mg/dL
Hgb A1c MFr Bld: 5.9 % — ABNORMAL HIGH (ref 4.8–5.6)

## 2018-05-29 NOTE — Progress Notes (Signed)
Chief Complaint  Patient presents with  . Hypertension    nonfasting med check, no new concerns.      Pre-diabetes--She cut back on sweets, carbs, sugar.  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  She had labs done prior to visit, see below.  Smoker:  She is smoking now up to 1/2 pack daily (restarted after quitting vaping only 3-4/day after meals then, but now much more due to increased stressors at home). She quit for a year in the past with use of Chantix. +stressors--brother is living with her, not contributing in any way (works daily); concerned he is on drugs. Has been living with her x 3 years. Doesn't feel unsafe at home. Feels like she needs to put him out.  She has had h/o elevated BP's in the past. BP's at home have been running 120-135/65-70's. Sees the higher BP when stressed. She continues to limit sodium in her diet.  Osteoporosis: She was started on alendronate in 08/2015, after DEXA showedosteoporosis with T-3.0 at R fem neck.She had f/u DEXA in 10/2017 which showed T-2.5 at R fem neck, statistically significant increase in BMD. She is tolerating this without side effects.Denies hip or jaw pain, denies dysphagia.  Vitamin D deficiency: Level was 12 in 07/2015. She was treated with 12 weeks of prescription vitamin D, and last check was normal at 31 in 02/2016. Level was low again at 14.5 in 08/2017.  She was treated with another 12 weeks of 50,000 IU weekly rx vitamin D, and follow-up test in 11/2017 was normal at 35.6. She is currently taking 1000 IU daily, plus a MVI. She had labs prior to visit, see below. She eats 3-4 cups of yogurt daily.   PMH, PSH, SH reviewed  Outpatient Encounter Medications as of 05/30/2018  Medication Sig Note  . alendronate (FOSAMAX) 70 MG tablet TAKE 1 TABLET EVERY 7 DAYS WITH A FULL GLASS OF WATER AND ON EMPTY STOMACH   . cholecalciferol  (VITAMIN D) 1000 UNITS tablet Take 5,000 Units by mouth daily. Reported on 09/09/2015 05/30/2018: Taking 1000 U daily  . Omega-3 Fatty Acids (FISH OIL) 1000 MG CAPS Take 1 capsule by mouth daily.   Marland Kitchen ibuprofen (ADVIL,MOTRIN) 800 MG tablet Take 1 tablet (800 mg total) by mouth 3 (three) times daily. (Patient not taking: Reported on 03/21/2018)    No facility-administered encounter medications on file as of 05/30/2018.    Allergies  Allergen Reactions  . Septra [Sulfamethoxazole-Trimethoprim]     Mouth itch    ROS:  No fever, chills, headaches, dizziness, chest pain, shortness of breath, URI symptoms.  Slight smoker's cough.  No urinary complaints. No bleeding, bruising, rash.  No GI complaints. +stressors at home.   PHYSICAL EXAM:  BP 120/68   Pulse 84   Ht 5' 0.25" (1.53 m)   Wt 156 lb (70.8 kg)   BMI 30.21 kg/m   Wt Readings from Last 3 Encounters:  05/30/18 156 lb (70.8 kg)  03/21/18 155 lb 12.8 oz (70.7 kg)  01/24/18 155 lb (70.3 kg)    Well appearing, pleasant female, in no distress HEENT: conjunctiva and sclera are clear, EOMI. OP clear Neck: no lymphadenopathy or mass, no bruit Heart: regular rate and rhythm Lungs: clear bilaterally Back: no CVA tenderness Abdomen: soft, nontender, no organomegaly or mass. Extremities: 2+ pulses, no edema Psych: normal mood, affect, hygiene and grooming Neuro: right facial weakness unchanged.  Alert, oriented, normal gait.  Lab Results  Component Value Date   HGBA1C 5.9 (H) 05/27/2018   Fasting glucose 92 Vitamin D-OH level of 16  Lab Results  Component Value Date   CHOL 196 05/27/2018   HDL 35 (L) 05/27/2018   LDLCALC 131 (H) 05/27/2018   TRIG 152 (H) 05/27/2018   CHOLHDL 5.6 (H) 05/27/2018    ASSESSMENT/PLAN:  Vitamin D deficiency - treat x 12 weeks with rx, then change to 4000 IU D3 daily - Plan: Vitamin D, Ergocalciferol, (DRISDOL) 1.25 MG (50000 UT) CAPS capsule  Prediabetes  Dyslipidemia - reviewed lowfat, low  cholesterol diet; quitting smoking and increased exercise will help raise HDL. Recheck at physical  Osteoporosis without current pathological fracture, unspecified osteoporosis type - continue alendronate, Ca.  Replacing D as stated above  Tobacco use - counseled re: cessation. If affordable, would like to retart Chantix. risks/SE reviewed in detail - Plan: varenicline (CHANTIX STARTING MONTH PAK) 0.5 MG X 11 & 1 MG X 42 tablet, varenicline (CHANTIX CONTINUING MONTH PAK) 1 MG tablet  F/u 6 months for CPE/AWV/med check, sooner prn.   We are prescribing another 12 week course of prescription vitamin D, taken once weekly.  You can continue your daily multivitamin, but stop the separate Vitamin D. Once you finish the prescription, I want you to start a higher dose of Vitamin D3, taking 4000 IU every day (plus continuing your multivitamin).  1000 IU is not enough to maintain your levels into the normal range.   Your cholesterol was only borderline.  The HDL was low--quitting smoking and getting daily exercise can raise this.  We discussed lowfat, low cholesterol diet--see additional information provided. We will rechek in 6 months, and then potentially discuss medications if not improved.  Your A1c was 5.9, remaining in the pre-diabetic range. Continue to limit sugar, carbs and get daily exercise to keep the sugar down.

## 2018-05-30 ENCOUNTER — Other Ambulatory Visit: Payer: Self-pay

## 2018-05-30 ENCOUNTER — Ambulatory Visit (INDEPENDENT_AMBULATORY_CARE_PROVIDER_SITE_OTHER): Payer: Medicare HMO | Admitting: Family Medicine

## 2018-05-30 ENCOUNTER — Encounter: Payer: Self-pay | Admitting: Family Medicine

## 2018-05-30 VITALS — BP 120/68 | HR 84 | Ht 60.25 in | Wt 156.0 lb

## 2018-05-30 DIAGNOSIS — R7303 Prediabetes: Secondary | ICD-10-CM

## 2018-05-30 DIAGNOSIS — M810 Age-related osteoporosis without current pathological fracture: Secondary | ICD-10-CM | POA: Diagnosis not present

## 2018-05-30 DIAGNOSIS — E785 Hyperlipidemia, unspecified: Secondary | ICD-10-CM

## 2018-05-30 DIAGNOSIS — E559 Vitamin D deficiency, unspecified: Secondary | ICD-10-CM

## 2018-05-30 DIAGNOSIS — Z720 Tobacco use: Secondary | ICD-10-CM

## 2018-05-30 MED ORDER — VITAMIN D (ERGOCALCIFEROL) 1.25 MG (50000 UNIT) PO CAPS: 50000.0000 [IU] | ORAL_CAPSULE | ORAL | 0 refills | Status: DC

## 2018-05-30 MED ORDER — VARENICLINE TARTRATE 0.5 MG X 11 & 1 MG X 42 PO MISC: ORAL | 0 refills | Status: DC

## 2018-05-30 MED ORDER — VARENICLINE TARTRATE 1 MG PO TABS: 1.0000 mg | ORAL_TABLET | Freq: Two times a day (BID) | ORAL | 1 refills | Status: DC

## 2018-05-30 NOTE — Patient Instructions (Addendum)
We are prescribing another 12 week course of prescription vitamin D, taken once weekly.  You can continue your daily multivitamin, but stop the separate Vitamin D. Once you finish the prescription, I want you to start a higher dose of Vitamin D3, taking 4000 IU every day (plus continuing your multivitamin).  1000 IU is not enough to maintain your levels into the normal range.   Your cholesterol was only borderline.  The HDL was low--quitting smoking and getting daily exercise can raise this.  We discussed lowfat, low cholesterol diet--see additional information provided. We will rechek in 6 months, and then potentially discuss medications if not improved.  Your A1c was 5.9, remaining in the pre-diabetic range. Continue to limit sugar, carbs and get daily exercise to keep the sugar down.  Please try and quit smoking--start thinking about why/when you smoke (habit, boredom, stress) in order to come up with effective strategies to cut back or quit. Available resources to help you quit include free counseling through [facility] Quitline ([place]Quitline.com or 1-800-QUITNOW), smoking cessation classes through [facility] (call to find out schedule), over-the-counter nicotine replacements, and e-cigarettes (although this may not help break the hand-mouth habit).  Many insurance companies also have smoking cessation programs (which may decrease the cost of patches, meds if enrolled).  We prescribed Chantix, since it was effective in the past.  Remember to set a quit date for 2 weeks after starting the medication.  Fat and Cholesterol Restricted Eating Plan Getting too much fat and cholesterol in your diet may cause health problems. Choosing the right foods helps keep your fat and cholesterol at normal levels. This can keep you from getting certain diseases.  What are tips for following this plan? Meal planning  At meals, divide your plate into four equal parts: ? Fill one-half of your plate  with vegetables and green salads. ? Fill one-fourth of your plate with whole grains. ? Fill one-fourth of your plate with low-fat (lean) protein foods.  Eat fish that is high in omega-3 fats at least two times a week. This includes mackerel, tuna, sardines, and salmon.  Eat foods that are high in fiber, such as whole grains, beans, apples, broccoli, carrots, peas, and barley. General tips   Work with your doctor to lose weight if you need to.  Avoid: ? Foods with added sugar. ? Fried foods. ? Foods with partially hydrogenated oils.  Limit alcohol intake to no more than 1 drink a day for nonpregnant women and 2 drinks a day for men. One drink equals 12 oz of beer, 5 oz of wine, or 1 oz of hard liquor. Reading food labels  Check food labels for: ? Trans fats. ? Partially hydrogenated oils. ? Saturated fat (g) in each serving. ? Cholesterol (mg) in each serving. ? Fiber (g) in each serving.  Choose foods with healthy fats, such as: ? Monounsaturated fats. ? Polyunsaturated fats. ? Omega-3 fats.  Choose grain products that have whole grains. Look for the word "whole" as the first word in the ingredient list. Cooking  Cook foods using low-fat methods. These include baking, boiling, grilling, and broiling.  Eat more home-cooked foods. Eat at restaurants and buffets less often.  Avoid cooking using saturated fats, such as butter, cream, palm oil, palm kernel oil, and coconut oil. Recommended foods  Fruits  All fresh, canned (in natural juice), or frozen fruits. Vegetables  Fresh or frozen vegetables (raw, steamed, roasted, or grilled). Green salads. Grains  Whole grains, such  as whole wheat or whole grain breads, crackers, cereals, and pasta. Unsweetened oatmeal, bulgur, barley, quinoa, or brown rice. Corn or whole wheat flour tortillas. Meats and other protein foods  Ground beef (85% or leaner), grass-fed beef, or beef trimmed of fat. Skinless chicken or [place]. Ground  chicken or [place]. Pork trimmed of fat. All fish and seafood. Egg whites. Dried beans, peas, or lentils. Unsalted nuts or seeds. Unsalted canned beans. Nut butters without added sugar or oil. Dairy  Low-fat or nonfat dairy products, such as skim or 1% milk, 2% or reduced-fat cheeses, low-fat and fat-free ricotta or cottage cheese, or plain low-fat and nonfat yogurt. Fats and oils  Tub margarine without trans fats. Light or reduced-fat mayonnaise and salad dressings. Avocado. Olive, canola, sesame, or safflower oils. The items listed above may not be a complete list of foods and beverages you can eat. Contact a dietitian for more information. Foods to avoid Fruits  Canned fruit in heavy syrup. Fruit in cream or butter sauce. Fried fruit. Vegetables  Vegetables cooked in cheese, cream, or butter sauce. Fried vegetables. Grains  White bread. White pasta. White rice. Cornbread. Bagels, pastries, and croissants. Crackers and snack foods that contain trans fat and hydrogenated oils. Meats and other protein foods  Fatty cuts of meat. Ribs, chicken wings, bacon, sausage, bologna, salami, chitterlings, fatback, hot dogs, bratwurst, and packaged lunch meats. Liver and organ meats. Whole eggs and egg yolks. Chicken and [place] with skin. Fried meat. Dairy  Whole or 2% milk, cream, half-and-half, and cream cheese. Whole milk cheeses. Whole-fat or sweetened yogurt. Full-fat cheeses. Nondairy creamers and whipped toppings. Processed cheese, cheese spreads, and cheese curds. Beverages  Alcohol. Sugar-sweetened drinks such as sodas, lemonade, and fruit drinks. Fats and oils  Butter, stick margarine, lard, shortening, ghee, or bacon fat. Coconut, palm kernel, and palm oils. Sweets and desserts  Corn syrup, sugars, honey, and molasses. Candy. Jam and jelly. Syrup. Sweetened cereals. Cookies, pies, cakes, donuts, muffins, and ice cream. The items listed above may not be a complete list of foods and  beverages you should avoid. Contact a dietitian for more information. Summary  Choosing the right foods helps keep your fat and cholesterol at normal levels. This can keep you from getting certain diseases.  At meals, fill one-half of your plate with vegetables and green salads.  Eat high-fiber foods, like whole grains, beans, apples, carrots, peas, and barley.  Limit added sugar, saturated fats, alcohol, and fried foods. This information is not intended to replace advice given to you by your health care provider. Make sure you discuss any questions you have with your health care provider. Document Released: 09/01/2011 Document Revised: 11/03/2017 Document Reviewed: 11/17/2016 Elsevier Interactive Patient Education  2019 [place] (COVID-19) Are you at risk?  Are you at risk for the Coronavirus (COVID-19)?  To be considered HIGH RISK for Coronavirus (COVID-19), you have to meet the following criteria:  . Traveled to [place], [place], [place], [place] or [place]; or in the [place] to [place], [place], [place], or [place]; and have fever, cough, and shortness of breath within the last 2 weeks of travel OR . Been in close contact with a person diagnosed with COVID-19 within the last 2 weeks and have fever, cough, and shortness of breath . IF YOU DO NOT MEET THESE CRITERIA, YOU ARE CONSIDERED LOW RISK FOR COVID-19.  What to do if you are HIGH RISK for COVID-19?  Marland Kitchen If you are having a  medical emergency, call 911. . Seek medical care right away. Before you go to a doctor's office, urgent care or emergency department, call ahead and tell them about your recent travel, contact with someone diagnosed with COVID-19, and your symptoms. You should receive instructions from your physician's office regarding next steps of care.  . When you arrive at healthcare provider, tell the healthcare staff immediately you have returned from visiting [place], [place], [place], [place] or [place]; or traveled in the [place] to [place], [place], [place], or [place]; in the last two weeks or you have been in close contact with a person diagnosed with COVID-19 in the last 2 weeks.   . Tell the health care staff about your symptoms: fever, cough and shortness of breath. . After you have been seen by a medical provider, you will be either: o Tested for (COVID-19) and discharged home on quarantine except to seek medical care if symptoms worsen, and asked to  - Stay home and avoid contact with others until you get your results (4-5 days)  - Avoid travel on public transportation if possible (such as bus, train, or airplane) or o Sent to the Emergency Department by EMS for evaluation, COVID-19 testing, and possible admission depending on your condition and test results.  What to do if you are LOW RISK for COVID-19?  Reduce your risk of any infection by using the same precautions used for avoiding the common cold or flu:  Marland Kitchen Wash your hands often with soap and warm water for at least 20 seconds.  If soap and water are not readily available, use an alcohol-based hand sanitizer with at least 60% alcohol.  . If coughing or sneezing, cover your mouth and nose by coughing or sneezing into the elbow areas of your shirt or coat, into a tissue or into your sleeve (not your hands). . Avoid shaking hands with others and consider head nods or verbal greetings only. . Avoid touching your eyes, nose, or mouth with unwashed hands.  . Avoid close contact with people who are sick. . Avoid places or events with large numbers of people in one location, like concerts or sporting events. . Carefully consider travel plans you have or are making. . If you are planning any travel outside or inside the [place], visit the CDC's Travelers' Health webpage for the latest health notices. . If you have some symptoms but not all symptoms, continue to monitor at home and seek medical attention if your symptoms  worsen. . If you are having a medical emergency, call 911.   [place] / e-Visit: [email or web address]         [facility]: [facility]: [phone]                   [facility]: [phone]

## 2018-07-11 ENCOUNTER — Telehealth: Payer: Self-pay | Admitting: Family Medicine

## 2018-07-11 NOTE — Telephone Encounter (Signed)
P.A. CHANTIX  Done and approved and Called pharmacy & cost starter pack $297 & then $350 with insurance,  No discount cards available due to Medicare.  This was after approved for co pay reduction.   Called pt and she is not able to afford this.  Will see if pt can qualify for pt assistance or see other options.

## 2018-07-13 NOTE — Telephone Encounter (Signed)
Pt Assistance application printed and mailed to pt to complete

## 2018-07-22 NOTE — Telephone Encounter (Signed)
Pt returned application, this was completed and faxed to [employer]

## 2018-07-30 NOTE — Telephone Encounter (Signed)
Pt Assistance approved til 03/16/19, waiting shipment of medication

## 2018-08-03 NOTE — Telephone Encounter (Signed)
Pt called back for dosing direction, these were given and sent to her email.  Also called & left message to break tablets in 1/2 per Dr.Lalonde for the first week and follow the directions since she doesn't have the started pack. Left detailed message regarding how to take Chantix

## 2018-08-03 NOTE — Telephone Encounter (Addendum)
#  3 bottles of Chantix 1 mg received for pt assistance, left message for pt

## 2018-08-14 ENCOUNTER — Other Ambulatory Visit: Payer: Self-pay | Admitting: Family Medicine

## 2018-08-14 DIAGNOSIS — E559 Vitamin D deficiency, unspecified: Secondary | ICD-10-CM

## 2018-08-23 ENCOUNTER — Other Ambulatory Visit: Payer: Self-pay | Admitting: Family Medicine

## 2018-08-23 DIAGNOSIS — M810 Age-related osteoporosis without current pathological fracture: Secondary | ICD-10-CM

## 2018-08-23 NOTE — Telephone Encounter (Signed)
Pt has an appt in the fall

## 2018-09-09 ENCOUNTER — Other Ambulatory Visit: Payer: Self-pay | Admitting: Family Medicine

## 2018-09-09 DIAGNOSIS — Z1231 Encounter for screening mammogram for malignant neoplasm of breast: Secondary | ICD-10-CM

## 2018-10-21 ENCOUNTER — Other Ambulatory Visit: Payer: Self-pay

## 2018-10-21 ENCOUNTER — Encounter (HOSPITAL_COMMUNITY): Payer: Self-pay

## 2018-10-21 ENCOUNTER — Ambulatory Visit (HOSPITAL_COMMUNITY)
Admission: EM | Admit: 2018-10-21 | Discharge: 2018-10-21 | Disposition: A | Discharge status: Home / Self Care | Payer: Medicare HMO | Attending: Family Medicine | Admitting: Family Medicine

## 2018-10-21 DIAGNOSIS — Z720 Tobacco use: Secondary | ICD-10-CM

## 2018-10-21 DIAGNOSIS — H66005 Acute suppurative otitis media without spontaneous rupture of ear drum, recurrent, left ear: Secondary | ICD-10-CM

## 2018-10-21 MED ORDER — AMOXICILLIN 875 MG PO TABS: 875.0000 mg | ORAL_TABLET | Freq: Two times a day (BID) | ORAL | 0 refills | Status: DC

## 2018-10-21 MED ORDER — IBUPROFEN 800 MG PO TABS: 800.0000 mg | ORAL_TABLET | Freq: Three times a day (TID) | ORAL | 0 refills | Status: DC

## 2018-10-21 NOTE — Discharge Instructions (Signed)
Consider flomax or nasacort nasal spray for the sinus drainage Take ibuprofen 3 x a day for ear pain.  Take with food Take amoxil 2 x a day for the infection Drink lots of fluids Call for problems

## 2018-10-21 NOTE — ED Provider Notes (Signed)
[place]    CSN: [id number] Arrival date & time: 10/21/18  1624      History   Chief Complaint Chief Complaint  Patient presents with  . Otalgia    HPI Summer Norris is a 68 y.o. female.   HPI  Patient has ear pain since yesterday.  Is getting progressively worse.  Swelling in the left ear.  Her hearing is diminished.  No pain with chewing.  No runny stuffy nose.  She took some Sudafed but this did not help.  She also tried taking Tylenol which did not help.  She has a history of an ear infection this year last year about this time.  She is here for evaluation.  Does not have any reason to have recurring ear infections, allergies, sinus abnormalities  Past Medical History:  Diagnosis Date  . Bell's palsy 03/10/2011   diagnosed in [place]. ongoing R sided facial weakness  . C7 radiculopathy 2016   right  . Carpal tunnel syndrome 2016   right  . Cataract    cataracts removed both eyes  . Osteoporosis   . Prediabetes   . Smoker     Patient Active Problem List   Diagnosis Date Noted  . Muscle tightness 04/28/2016  . Neck pain 04/28/2016  . Cervical radiculopathy 04/28/2016  . Osteoporosis 09/09/2015  . Cervical disc disorder with radiculopathy of cervical region 01/18/2015  . Carpal tunnel syndrome 01/18/2015  . Vitamin D deficiency 04/21/2012  . Bell's palsy 10/19/2011  . Tobacco use disorder 10/19/2011    Past Surgical History:  Procedure Laterality Date  . ABDOMINAL HYSTERECTOMY    . CATARACT EXTRACTION Bilateral 08/2014 and 09/2014   Dr. Herbert Deaner  . COLONOSCOPY  2009  . TOTAL ABDOMINAL HYSTERECTOMY W/ BILATERAL SALPINGOOPHORECTOMY  1995    OB History    Gravida  2   Para      Term      Preterm      AB  2   Living  0     SAB  2   TAB      Ectopic      Multiple      Live Births               Home Medications    Prior to Admission medications   Medication Sig Start Date End Date Taking? Authorizing Provider   alendronate (FOSAMAX) 70 MG tablet TAKE 1 TABLET EVERY 7 DAYS WITH A FULL GLASS OF WATER AND ON EMPTY STOMACH 08/23/18   Rita Ohara, MD  amoxicillin (AMOXIL) 875 MG tablet Take 1 tablet (875 mg total) by mouth 2 (two) times daily. 10/21/18   Raylene Everts, MD  cholecalciferol (VITAMIN D) 1000 UNITS tablet Take 5,000 Units by mouth daily. Reported on 09/09/2015    [provider]  ibuprofen (ADVIL) 800 MG tablet Take 1 tablet (800 mg total) by mouth 3 (three) times daily. 10/21/18   Raylene Everts, MD  Multiple Vitamin (MULTIVITAMIN) tablet Take 1 tablet by mouth daily.    [provider]  Omega-3 Fatty Acids (FISH OIL) 1000 MG CAPS Take 1 capsule by mouth daily.    [provider]  Vitamin D, Ergocalciferol, (DRISDOL) 1.25 MG (50000 UT) CAPS capsule Take 1 capsule (50,000 Units total) by mouth every 7 (seven) days. 05/30/18   Rita Ohara, MD    Family History Family History  Problem Relation Age of Onset  . Diabetes Mother   . Heart disease  Father   . Arthritis Sister   . Diabetes Sister        prediabetes  . Sarcoidosis Brother   . Heart disease Brother 55  . Lymphoma Brother 75  . Cancer Brother 83       lymphoma  . Diabetes Sister        prediabetes  . Asthma Sister   . Colon cancer Neg Hx   . Colon polyps Neg Hx   . Esophageal cancer Neg Hx   . Rectal cancer Neg Hx   . Stomach cancer Neg Hx     Social History Social History   Tobacco Use  . Smoking status: Current Every Day Smoker    Packs/day: 1.00    Years: 42.00    Pack years: 42.00    Types: E-cigarettes, Cigarettes  . Smokeless tobacco: Never Used  Substance Use Topics  . Alcohol use: Yes    Alcohol/week: 0.0 standard drinks    Comment: 0-1 drinks per month, maybe.  . Drug use: No     Allergies   Septra [sulfamethoxazole-trimethoprim]   Review of Systems Review of Systems  Constitutional: Negative for chills and fever.  HENT: Positive for congestion and ear pain. Negative  for ear discharge and sore throat.   Eyes: Negative for pain and visual disturbance.  Respiratory: Negative for cough and shortness of breath.   Cardiovascular: Negative for chest pain and palpitations.  Gastrointestinal: Negative for abdominal pain and vomiting.  Genitourinary: Negative for dysuria and hematuria.  Musculoskeletal: Negative for arthralgias and back pain.  Skin: Negative for color change and rash.  Neurological: Negative for seizures and syncope.  All other systems reviewed and are negative.    Physical Exam Triage Vital Signs ED Triage Vitals  Enc Vitals Group     BP 10/21/18 1643 (!) 144/71     Pulse Rate 10/21/18 1643 72     Resp --      Temp 10/21/18 1643 98.2 F (36.8 C)     Temp Source 10/21/18 1643 Oral     SpO2 10/21/18 1643 100 %     Weight --      Height --      Head Circumference --      Peak Flow --      Pain Score 10/21/18 1641 8     Pain Loc --      Pain Edu? --      Excl. in [place]? --    No data found.  Updated Vital Signs BP (!) 144/71 (BP Location: Right Arm)   Pulse 72   Temp 98.2 F (36.8 C) (Oral)   SpO2 100%   Visual Acuity Right Eye Distance:   Left Eye Distance:   Bilateral Distance:    Right Eye Near:   Left Eye Near:    Bilateral Near:     Physical Exam Constitutional:      General: She is not in acute distress.    Appearance: She is well-developed.  HENT:     Head: Normocephalic and atraumatic.     Right Ear: Tympanic membrane, ear canal and external ear normal.     Left Ear: Ear canal and external ear normal.     Ears:     Comments: Left TM is injected.  Dull.    Nose: Nose normal. No congestion.     Mouth/Throat:     Mouth: Mucous membranes are moist.     Pharynx: No posterior oropharyngeal erythema.  Eyes:  Conjunctiva/sclera: Conjunctivae normal.     Pupils: Pupils are equal, round, and reactive to light.  Neck:     Musculoskeletal: Normal range of motion.  Cardiovascular:     Rate and Rhythm: Normal  rate and regular rhythm.     Heart sounds: Normal heart sounds.  Pulmonary:     Effort: Pulmonary effort is normal. No respiratory distress.     Breath sounds: Normal breath sounds.  Abdominal:     General: There is no distension.     Palpations: Abdomen is soft.  Musculoskeletal: Normal range of motion.  Lymphadenopathy:     Cervical: No cervical adenopathy.  Skin:    General: Skin is warm and dry.  Neurological:     Mental Status: She is alert.  Psychiatric:        Mood and Affect: Mood normal.        Behavior: Behavior normal.      UC Treatments / Results  Labs (all labs ordered are listed, but only abnormal results are displayed) Labs Reviewed - No data to display  EKG   Radiology No results found.  Procedures Procedures (including critical care time)  Medications Ordered in UC Medications - No data to display  Initial Impression / Assessment and Plan / UC Course  I have reviewed the triage vital signs and the nursing notes.  Pertinent labs & imaging results that were available during my care of the patient were reviewed by me and considered in my medical decision making (see chart for details).      Final Clinical Impressions(s) / UC Diagnoses   Final diagnoses:  Recurrent acute suppurative otitis media without spontaneous rupture of left tympanic membrane     Discharge Instructions     Consider flomax or nasacort nasal spray for the sinus drainage Take ibuprofen 3 x a day for ear pain.  Take with food Take amoxil 2 x a day for the infection Drink lots of fluids Call for problems   ED Prescriptions    Medication Sig Dispense Auth. Provider   ibuprofen (ADVIL) 800 MG tablet Take 1 tablet (800 mg total) by mouth 3 (three) times daily. 21 tablet Raylene Everts, MD   amoxicillin (AMOXIL) 875 MG tablet Take 1 tablet (875 mg total) by mouth 2 (two) times daily. 14 tablet Raylene Everts, MD     Controlled Substance Prescriptions [place]  Controlled Substance Registry consulted? Not Applicable   Raylene Everts, MD 10/21/18 [phone]

## 2018-10-21 NOTE — ED Triage Notes (Signed)
Patient presents to Urgent Care with complaints of left ear pain, intermittently since yesterday. Patient reports she has had some sinus congestion, took OTC meds with no improvement.

## 2018-10-25 ENCOUNTER — Other Ambulatory Visit: Payer: Self-pay

## 2018-10-25 ENCOUNTER — Ambulatory Visit
Admission: RE | Admit: 2018-10-25 | Discharge: 2018-10-25 | Disposition: A | Discharge status: Home / Self Care | Payer: Medicare HMO | Source: Ambulatory Visit | Attending: Family Medicine | Admitting: Family Medicine

## 2018-10-25 DIAGNOSIS — Z1231 Encounter for screening mammogram for malignant neoplasm of breast: Secondary | ICD-10-CM

## 2018-12-01 DIAGNOSIS — H04123 Dry eye syndrome of bilateral lacrimal glands: Secondary | ICD-10-CM | POA: Diagnosis not present

## 2018-12-01 DIAGNOSIS — H35033 Hypertensive retinopathy, bilateral: Secondary | ICD-10-CM | POA: Diagnosis not present

## 2018-12-01 DIAGNOSIS — Z961 Presence of intraocular lens: Secondary | ICD-10-CM | POA: Diagnosis not present

## 2018-12-01 DIAGNOSIS — H35423 Microcystoid degeneration of retina, bilateral: Secondary | ICD-10-CM | POA: Diagnosis not present

## 2018-12-14 DIAGNOSIS — Z86010 Personal history of colonic polyps: Secondary | ICD-10-CM | POA: Insufficient documentation

## 2018-12-14 NOTE — Progress Notes (Signed)
Chief Complaint  Patient presents with  . Medicare Wellness    nonfasting (blood in lab) AWV with pelvic exam. Patient sees eye doctor. No concerns. Doing COVID study through [employer].     Summer Norris is a 68 y.o. female who presents for annual physical exam, Medicare wellness visit and follow-up on chronic medical conditions.  She has the following concerns:  She was seen in the ER in August for L ear infection, treated with amoxil. She denies any current ear problems.  Pre-diabetes--She cut backon sweets, carbs, sugar. She does some walking on the job, no other regular exercises currently. 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 Lab Results  Component Value Date   HGBA1C 5.9 (H) 05/27/2018   Smoker: She was started back on Chantix in April. She tried to take it, made her feel nauseated, felt like a "zombie". She took it for a week and a half, and stopped it.  She has a lot of stress at home right now. Not sure if that contributes to her side effects, tolerated it in the past. She continues to smoke 1/2 pack daily(restartedafter quitting vaping only 3-4/day after meals then, but now much more due to increased stressors at home).She quit for a year in the past with use of Chantix. +stressors--brother is living with her, not contributing in any way (works daily); concerned he is on drugs. Has been living with her x 3 years. She has been working through the court system to get him out of the house.  He is now doing drugs in the house.  She has had h/o elevated BP's in the past. BP's at home have been running 120-140/65-70's. Today she had an argument with her brother prior to her appointment . Sees the higher BP's at home  when stressed. Shecontinues to limit sodium in her diet.  Osteoporosis: She was started on alendronate in 08/2015, after DEXA showedosteoporosis with T-3.0 at R fem neck.She had f/u  DEXA in 8/2019which showed T-2.5 at R fem neck, statistically significant increase in BMD. She is tolerating this without side effects.Denies hip or jaw pain, denies dysphagia.  Vitamin D deficiency: She has been treated with prescription courses of D multiple times. Level was 14.5 in 08/2017. She was treated with another 12 weeks of 50,000 IU weekly rx vitamin D, and follow-up test in 11/2017 was normal at 35.6. in 05/2018 her level was back down to 16; at that time she was taking 1000 IU daily, plus a MVI. She was treated with another 12 weeks of prescription vitamin D, and told that upon completion of the rx she should start taking 4000 IU of D3 plus her MVI.  She has been taking 4000 IU plus MVI daily. She eats 3-4 cups of yogurt daily.  Immunization History  Administered Date(s) Administered  . Influenza, High Dose Seasonal PF 12/24/2017  . Pneumococcal Conjugate-13 12/24/2017  . Tdap 10/19/2011   Last Pap smear: N/A, s/p hysterectomy (for scar tissue, benign) Last mammogram: 10/2018 Last colonoscopy: 01/2018 with Dr. Silverio Decamp.  There were many polyps, which were adenomatous, and 3 year follow-up recommended.  Also noted to have diverticulosis, non-bleeding internal hemorrhoids, and a non-bleeding colonic angioectasia. Last DEXA: 10/2017 T-2.5 at R fem neck (improved from T-3.0 in 08/2015) Dentist: regularly Ophtho: went recently Exercise:Walking/on her feet at work; lifts some heavy items at work (20-40#).   Other doctors caring for patient include:  Dentist:  Dr. Claiborne Billings Ophtho: Dr. Herbert Deaner GI: Dr. Reece Packer: Dr. Allyson Sabal (in past) Neuro:  Dr. Krista Blue (2013 for Bell's Palsy), Dr. Posey Pronto (did EMG/NCV in 2016) GYN: Dr. Gaetano Net (no longer sees)   Depression screen:  Negative Fall Screen: negative Function Status Survey: notable only for leakage of urine (with cough/sneeze) Mini-Cog screen: normal See full screens in Epic   End of Life Discussion:  Patient does not have a living  will and medical power of attorney. Forms given last year, still has at home.  Past Medical History:  Diagnosis Date  . Bell's palsy 03/10/2011   diagnosed in [place]. ongoing R sided facial weakness  . C7 radiculopathy 2016   right  . Carpal tunnel syndrome 2016   right  . Cataract    cataracts removed both eyes  . Osteoporosis   . Prediabetes   . Smoker     Past Surgical History:  Procedure Laterality Date  . ABDOMINAL HYSTERECTOMY    . CATARACT EXTRACTION Bilateral 08/2014 and 09/2014   Dr. Herbert Deaner  . COLONOSCOPY  2009  . TOTAL ABDOMINAL HYSTERECTOMY W/ BILATERAL SALPINGOOPHORECTOMY  1995    Social History   Socioeconomic History  . Marital status: Legally Separated    Spouse name: Not on file  . Number of children: Not on file  . Years of education: Not on file  . Highest education level: Not on file  Occupational History  . Occupation: parts Careers adviser: [employer]  . Financial resource strain: Not on file  . Food insecurity    Worry: Not on file    Inability: Not on file  . Transportation needs    Medical: Not on file    Non-medical: Not on file  Tobacco Use  . Smoking status: Current Every Day Smoker    Packs/day: 0.50    Years: 42.00    Pack years: 21.00    Types: E-cigarettes, Cigarettes  . Smokeless tobacco: Never Used  Substance and Sexual Activity  . Alcohol use: Yes    Alcohol/week: 0.0 standard drinks    Comment: 0-1 drinks per month, maybe.  . Drug use: No  . Sexual activity: Not Currently    Birth control/protection: Surgical  Lifestyle  . Physical activity    Days per week: Not on file    Minutes per session: Not on file  . Stress: Not on file  Relationships  . Social Herbalist on phone: Not on file    Gets together: Not on file    Attends religious service: Not on file    Active member of club or organization: Not on file    Attends meetings of clubs or organizations: Not on file     Relationship status: Not on file  . Intimate partner violence    Fear of current or ex partner: Not on file    Emotionally abused: Not on file    Physically abused: Not on file    Forced sexual activity: Not on file  Other Topics Concern  . Not on file  Social History Narrative   Widowed and re-married, and then divorced. No children. Her brother has been living with her for 3 years--causing friction drug use; she is trying to get him out of the house.   Works part-time at [employer] History  Problem Relation Age of Onset  . Diabetes Mother   . Heart disease Father   .  Arthritis Sister   . Diabetes Sister        prediabetes  . Sarcoidosis Brother   . Heart disease Brother 56  . Lymphoma Brother 33  . Cancer Brother 34       lymphoma  . Diabetes Sister        prediabetes  . Asthma Sister   . Drug abuse Brother   . Stroke Brother 18  . Kidney disease Brother   . Heart disease Brother        defibrillator  . Hypertension Brother   . Colon cancer Neg Hx   . Colon polyps Neg Hx   . Esophageal cancer Neg Hx   . Rectal cancer Neg Hx   . Stomach cancer Neg Hx     Outpatient Encounter Medications as of 12/15/2018  Medication Sig Note  . alendronate (FOSAMAX) 70 MG tablet TAKE 1 TABLET EVERY 7 DAYS WITH A FULL GLASS OF WATER AND ON EMPTY STOMACH   . cholecalciferol (VITAMIN D) 1000 UNITS tablet Take 5,000 Units by mouth daily. Reported on 09/09/2015 12/15/2018: Taking 4000 IU daily  . Multiple Vitamin (MULTIVITAMIN) tablet Take 1 tablet by mouth daily.   Marland Kitchen ibuprofen (ADVIL) 800 MG tablet Take 1 tablet (800 mg total) by mouth 3 (three) times daily. (Patient not taking: Reported on 12/15/2018)   . Omega-3 Fatty Acids (FISH OIL) 1000 MG CAPS Take 1 capsule by mouth daily.   . [DISCONTINUED] amoxicillin (AMOXIL) 875 MG tablet Take 1 tablet (875 mg total) by mouth 2 (two) times daily.   . [DISCONTINUED] Vitamin D, Ergocalciferol, (DRISDOL) 1.25 MG (50000 UT) CAPS  capsule Take 1 capsule (50,000 Units total) by mouth every 7 (seven) days.    No facility-administered encounter medications on file as of 12/15/2018.     Allergies  Allergen Reactions  . Septra [Sulfamethoxazole-Trimethoprim]     Mouth itch    ROS: The patient denies anorexia, fever, headaches, decreased hearing, ear pain, sore throat, breast concerns, chest pain, palpitations, dizziness, syncope, dyspnea on exertion, swelling, nausea, vomiting, diarrhea, constipation, abdominal pain, melena, hematochezia, hematuria, dysuria, vaginal bleeding, discharge, odor or itch, genital lesions, joint pains (intermittent neck and right shoulder pain, very rarely, lasts for a dayI), numbness, tingling, weakness, tremor, suspicious skin lesions, depression, anxiety, abnormal bleeding/bruising, or enlarged lymph nodes. No longer having any heartburn. Slight urinary leakage with cough    PHYSICAL EXAM:  BP 140/80   Pulse 88   Temp 98.9 F (37.2 C) (Other (Comment))   Ht 5' (1.524 m)   Wt 156 lb 6.4 oz (70.9 kg)   BMI 30.54 kg/m   150/70 on repeat by MD  Wt Readings from Last 3 Encounters:  05/30/18 156 lb (70.8 kg)  03/21/18 155 lb 12.8 oz (70.7 kg)  01/24/18 155 lb (70.3 kg)   General Appearance:   Alert, cooperative, no distress, appears stated age  Head:   Normocephalic, without obvious abnormality, atraumatic. Right sided facial weakness noted, chronic/unchanged from prior visits.  Eyes:   PERRL, conjunctiva/corneas clear, EOM's intact; fundi benign  Ears:   TM's and EAC's normal--left was somewhat hard to visualize due to some dried cerumen at periphery.  Nose:  Not examined, wearing mask due to COVID-19 pandemic  Throat:  Not examined, wearing mask due to COVID-19 pandemic  Neck:  Supple, no lymphadenopathy; thyroid: noenlargement/ tenderness/nodules; slight bruit noted bilaterally, ?if referred murmur. Slightly more prominent on the left  Back:  Spine  nontender, no curvature, ROM normal, no CVA tenderness  Lungs:   Clear to auscultation bilaterally without wheezes, rales or ronchi; respirations unlabored.  Chest Wall:   No tenderness or deformity  Heart:   Regular rate and rhythm, S1 and S2 normal, rub  or gallop. 2/6 SEM noted at the RUSB today.  Breast Exam:   No tenderness, masses, or nipple discharge or inversion. No axillary lymphadenopathy  Abdomen:   Soft, non-tender, nondistended, normoactive bowel sounds,   no masses, no hepatosplenomegaly. WHSS  Genitalia:   Normal external genitalia without lesions. BUS and vagina normal; uterus surgically absent. No abnormal vaginal discharge. No adnexal masses. Pap not performed  Rectal:   Normal tone (somewhat lax at first, but can tighten muscle well), no masses or tenderness, heme negative stool.  Extremities:  No clubbing, cyanosis or edema  Pulses:  2+ and symmetric all extremities  Skin:  Skin color, texture, turgor normal, no rashes or lesions  Lymph nodes:  Cervical, supraclavicular, and axillary nodes normal  Neurologic:  Some facial asymmetry due to chronic right-sided weakness from prior Bell's palsy; normal strength, sensation and gait; reflexes 2+ and symmetric throughout   Psych: Normal mood, affect, hygiene and grooming   Lab Results  Component Value Date   HGBA1C 5.8 (A) 12/15/2018    ASSESSMENT/PLAN:  Annual physical exam - Plan: VITAMIN D 25 Hydroxy (Vit-D Deficiency, Fractures), CBC with Differential/Platelet, Comprehensive metabolic panel, Lipid panel  Medicare annual wellness visit, initial  Vitamin D deficiency - Plan: VITAMIN D 25 Hydroxy (Vit-D Deficiency, Fractures)  Dyslipidemia  Prediabetes - counseled re: proper diet, exercise, weight loss - Plan: HgB A1c, Comprehensive metabolic panel  Hx of adenomatous colonic polyps  Osteoporosis without current pathological fracture,  unspecified osteoporosis type - continue alendronate; counseled re: Ca, D, weight-bearing exercise. repeat DEXA next year  Tobacco use - counseled; encouraged to re-try Chantix in future, when stressors improved  Need for influenza vaccination - Plan: Flu Vaccine QUAD High Dose(Fluad)  Need for pneumococcal vaccination - Plan: Pneumococcal polysaccharide vaccine 23-valent greater than or equal to 2yo subcutaneous/IM  Elevated blood pressure reading in office without diagnosis of hypertension - normal at home, related to stressors.  Discussed stress reduction techniques. Cont to monitor   Murmur and bilateral slight bruit vs radiation of murmur noted today. If noted at f/u, may need additional eval.   Discussed monthly self breast exams and yearly mammograms; at least 30 minutes of aerobic activity at least 5 days/week, weight-bearing exercise at least 2x/wk; proper sunscreen use reviewed; healthy diet, including goals of calcium and vitamin D intake and alcohol recommendations (less than or equal to 1 drink/day) reviewed; regular seatbelt use; changing batteries in smoke detectors. Immunization recommendations discussed--hgih dose flu shot and pneumovax given today, risks/SE reviewed. Shingrix is also recommended--discussed risks/SE, and she should get from pharmacy. Colon cancer screening recommendations--UTD, repeat 01/2021 (3 year f/u).    Full Code, Full Care. Reminded of the importance of living will and healthcare POA, and encouraged to fill out, notarize and get [place] a copy at her convenience.    Medicare Attestation I have personally reviewed: The patient's medical and social history Their use of alcohol, tobacco or illicit drugs Their current medications and supplements The patient's functional ability including ADLs,fall risks, home safety risks, cognitive, and hearing and visual impairment Diet and physical activities Evidence for depression or mood disorders  The  patient's weight, height, BMI, and visual acuity have been recorded in the chart.  I have made referrals, counseling, and provided education to the patient  based on review of the above and I have provided the patient with a written personalized care plan for preventive services.

## 2018-12-14 NOTE — Patient Instructions (Addendum)
HEALTH MAINTENANCE RECOMMENDATIONS:  It is recommended that you get at least 30 minutes of aerobic exercise at least 5 days/week (for weight loss, you may need as much as 60-90 minutes). This can be any activity that gets your heart rate up. This can be divided in 10-15 minute intervals if needed, but try and build up your endurance at least once a week.  Weight bearing exercise is also recommended twice weekly.  Eat a healthy diet with lots of vegetables, fruits and fiber.  "Colorful" foods have a lot of vitamins (ie green vegetables, tomatoes, red peppers, etc).  Limit sweet tea, regular sodas and alcoholic beverages, all of which has a lot of calories and sugar.  Up to 1 alcoholic drink daily may be beneficial for women (unless trying to lose weight, watch sugars).  Drink a lot of water.  Calcium recommendations are 1200-1500 mg daily (1500 mg for postmenopausal women or women without ovaries), and vitamin D 1000 IU daily.  This should be obtained from diet and/or supplements (vitamins), and calcium should not be taken all at once, but in divided doses.  Monthly self breast exams and yearly mammograms for women over the age of 10 is recommended.  Sunscreen of at least SPF 30 should be used on all sun-exposed parts of the skin when outside between the hours of 10 am and 4 pm (not just when at beach or pool, but even with exercise, golf, tennis, and yard work!)  Use a sunscreen that says "broad spectrum" so it covers both UVA and UVB rays, and make sure to reapply every 1-2 hours.  Remember to change the batteries in your smoke detectors when changing your clock times in the spring and fall. Carbon monoxide detectors are recommended for your home.  Use your seat belt every time you are in a car, and please drive safely and not be distracted with cell phones and texting while driving.   Summer Norris , Thank you for taking time to come for your Medicare Wellness Visit. I appreciate your ongoing  commitment to your health goals. Please review the following plan we discussed and let me know if I can assist you in the future.   This is a list of the screening recommended for you and due dates:  Health Maintenance  Topic Date Due  . Flu Shot  10/15/2018  . Pneumonia vaccines (2 of 2 - PPSV23) 12/25/2018  . Mammogram  10/25/2019  . Colon Cancer Screening  01/24/2021  . Tetanus Vaccine  10/18/2021  . DEXA scan (bone density measurement)  Completed  .  Hepatitis C: One time screening is recommended by Center for Disease Control  (CDC) for  adults born from [birth year] through 1963.   Completed   You were given flu shot and pneumovax (second of the pneumonia vaccines) today.  I recommend getting the new shingles vaccine (Shingrix). You will need to get this from the pharmacy rather than our office, as it is covered by Medicare part D.  It is a series of 2 injections, spaced 2 months apart.  Next Bone density test will be due  10/2019. Continue the alendronate weekly.  You can always restart the Chantix (with the directions given to mimic the starter pack) if/when you are ready, when your stressors are a little better. Remember to set the quit date for 10-14 days after starting it.  Continue regular dental exams and eye exams (your next is 12/04/2019).  Remember to do your kegel's exercises daily to  help with the urine leakage   Kegel Exercises  Kegel exercises can help strengthen your pelvic floor muscles. The pelvic floor is a group of muscles that support your rectum, small intestine, and bladder. In females, pelvic floor muscles also help support the womb (uterus). These muscles help you control the flow of urine and stool. Kegel exercises are painless and simple, and they do not require any equipment. Your provider may suggest Kegel exercises to:  Improve bladder and bowel control.  Improve sexual response.  Improve weak pelvic floor muscles after surgery to remove the uterus  (hysterectomy) or pregnancy (females).  Improve weak pelvic floor muscles after prostate gland removal or surgery (males). Kegel exercises involve squeezing your pelvic floor muscles, which are the same muscles you squeeze when you try to stop the flow of urine or keep from passing gas. The exercises can be done while sitting, standing, or lying down, but it is best to vary your position. Exercises How to do Kegel exercises: 1. Squeeze your pelvic floor muscles tight. You should feel a tight lift in your rectal area. If you are a female, you should also feel a tightness in your vaginal area. Keep your stomach, buttocks, and legs relaxed. 2. Hold the muscles tight for up to 10 seconds. 3. Breathe normally. 4. Relax your muscles. 5. Repeat as told by your health care provider. Repeat this exercise daily as told by your health care provider. Continue to do this exercise for at least 4-6 weeks, or for as long as told by your health care provider. You may be referred to a physical therapist who can help you learn more about how to do Kegel exercises. Depending on your condition, your health care provider may recommend:  Varying how long you squeeze your muscles.  Doing several sets of exercises every day.  Doing exercises for several weeks.  Making Kegel exercises a part of your regular exercise routine. This information is not intended to replace advice given to you by your health care provider. Make sure you discuss any questions you have with your health care provider. Document Released: 02/17/2012 Document Revised: 10/20/2017 Document Reviewed: 10/20/2017 Elsevier Patient Education  2020 [employer].

## 2018-12-15 ENCOUNTER — Ambulatory Visit (INDEPENDENT_AMBULATORY_CARE_PROVIDER_SITE_OTHER): Payer: Medicare HMO | Admitting: Family Medicine

## 2018-12-15 ENCOUNTER — Encounter: Payer: Self-pay | Admitting: Family Medicine

## 2018-12-15 ENCOUNTER — Ambulatory Visit: Payer: Medicare HMO | Admitting: Family Medicine

## 2018-12-15 ENCOUNTER — Other Ambulatory Visit: Payer: Self-pay

## 2018-12-15 VITALS — BP 140/80 | HR 88 | Temp 98.9°F | Ht 60.0 in | Wt 156.4 lb

## 2018-12-15 DIAGNOSIS — Z136 Encounter for screening for cardiovascular disorders: Secondary | ICD-10-CM | POA: Diagnosis not present

## 2018-12-15 DIAGNOSIS — E559 Vitamin D deficiency, unspecified: Secondary | ICD-10-CM

## 2018-12-15 DIAGNOSIS — Z23 Encounter for immunization: Secondary | ICD-10-CM

## 2018-12-15 DIAGNOSIS — E785 Hyperlipidemia, unspecified: Secondary | ICD-10-CM

## 2018-12-15 DIAGNOSIS — Z720 Tobacco use: Secondary | ICD-10-CM

## 2018-12-15 DIAGNOSIS — M810 Age-related osteoporosis without current pathological fracture: Secondary | ICD-10-CM | POA: Diagnosis not present

## 2018-12-15 DIAGNOSIS — Z86010 Personal history of colonic polyps: Secondary | ICD-10-CM | POA: Diagnosis not present

## 2018-12-15 DIAGNOSIS — R7303 Prediabetes: Secondary | ICD-10-CM

## 2018-12-15 DIAGNOSIS — R030 Elevated blood-pressure reading, without diagnosis of hypertension: Secondary | ICD-10-CM

## 2018-12-15 DIAGNOSIS — Z0000 Encounter for general adult medical examination without abnormal findings: Secondary | ICD-10-CM

## 2018-12-15 LAB — POCT GLYCOSYLATED HEMOGLOBIN (HGB A1C): Hemoglobin A1C: 5.8 % — AB (ref 4.0–5.6)

## 2018-12-16 LAB — COMPREHENSIVE METABOLIC PANEL
ALT: 13 IU/L (ref 0–32)
AST: 15 IU/L (ref 0–40)
Albumin/Globulin Ratio: 1.8 (ref 1.2–2.2)
Albumin: 4.6 g/dL (ref 3.8–4.8)
Alkaline Phosphatase: 81 IU/L (ref 39–117)
BUN/Creatinine Ratio: 13 (ref 12–28)
BUN: 12 mg/dL (ref 8–27)
Bilirubin Total: 0.6 mg/dL (ref 0.0–1.2)
CO2: 27 mmol/L (ref 20–29)
Calcium: 9.5 mg/dL (ref 8.7–10.3)
Chloride: 105 mmol/L (ref 96–106)
Creatinine, Ser: 0.93 mg/dL (ref 0.57–1.00)
GFR calc Af Amer: 74 mL/min/{1.73_m2} (ref >59)
GFR calc non Af Amer: 64 mL/min/{1.73_m2} (ref >59)
Globulin, Total: 2.6 g/dL (ref 1.5–4.5)
Glucose: 98 mg/dL (ref 65–99)
Potassium: 4.6 mmol/L (ref 3.5–5.2)
Sodium: 144 mmol/L (ref 134–144)
Total Protein: 7.2 g/dL (ref 6.0–8.5)

## 2018-12-16 LAB — LIPID PANEL
Chol/HDL Ratio: 5.2 ratio — ABNORMAL HIGH (ref 0.0–4.4)
Cholesterol, Total: 178 mg/dL (ref 100–199)
HDL: 34 mg/dL — ABNORMAL LOW (ref >39)
LDL Chol Calc (NIH): 118 mg/dL — ABNORMAL HIGH (ref 0–99)
Triglycerides: 142 mg/dL (ref 0–149)
VLDL Cholesterol Cal: 26 mg/dL (ref 5–40)

## 2018-12-16 LAB — CBC WITH DIFFERENTIAL/PLATELET
Basophils Absolute: 0.1 10*3/uL (ref 0.0–0.2)
Basos: 1 %
EOS (ABSOLUTE): 0.2 10*3/uL (ref 0.0–0.4)
Eos: 2 %
Hematocrit: 47 % — ABNORMAL HIGH (ref 34.0–46.6)
Hemoglobin: 15.5 g/dL (ref 11.1–15.9)
Immature Grans (Abs): 0 10*3/uL (ref 0.0–0.1)
Immature Granulocytes: 0 %
Lymphocytes Absolute: 2.4 10*3/uL (ref 0.7–3.1)
Lymphs: 22 %
MCH: 29.1 pg (ref 26.6–33.0)
MCHC: 33 g/dL (ref 31.5–35.7)
MCV: 88 fL (ref 79–97)
Monocytes Absolute: 0.6 10*3/uL (ref 0.1–0.9)
Monocytes: 6 %
Neutrophils Absolute: 7.7 10*3/uL — ABNORMAL HIGH (ref 1.4–7.0)
Neutrophils: 69 %
Platelets: 398 10*3/uL (ref 150–450)
RBC: 5.32 x10E6/uL — ABNORMAL HIGH (ref 3.77–5.28)
RDW: 13 % (ref 11.7–15.4)
WBC: 11 10*3/uL — ABNORMAL HIGH (ref 3.4–10.8)

## 2018-12-16 LAB — VITAMIN D 25 HYDROXY (VIT D DEFICIENCY, FRACTURES): Vit D, 25-Hydroxy: 20.3 ng/mL — ABNORMAL LOW (ref 30.0–100.0)

## 2018-12-19 ENCOUNTER — Other Ambulatory Visit: Payer: Self-pay | Admitting: *Deleted

## 2018-12-19 MED ORDER — ERGOCALCIFEROL 1.25 MG (50000 UT) PO CAPS: 50000.0000 [IU] | ORAL_CAPSULE | ORAL | 0 refills | Status: DC

## 2019-01-23 DIAGNOSIS — M810 Age-related osteoporosis without current pathological fracture: Secondary | ICD-10-CM | POA: Diagnosis not present

## 2019-01-23 DIAGNOSIS — Z823 Family history of stroke: Secondary | ICD-10-CM | POA: Diagnosis not present

## 2019-01-23 DIAGNOSIS — Z833 Family history of diabetes mellitus: Secondary | ICD-10-CM | POA: Diagnosis not present

## 2019-01-23 DIAGNOSIS — Z7983 Long term (current) use of bisphosphonates: Secondary | ICD-10-CM | POA: Diagnosis not present

## 2019-01-23 DIAGNOSIS — Z809 Family history of malignant neoplasm, unspecified: Secondary | ICD-10-CM | POA: Diagnosis not present

## 2019-01-23 DIAGNOSIS — Z8249 Family history of ischemic heart disease and other diseases of the circulatory system: Secondary | ICD-10-CM | POA: Diagnosis not present

## 2019-01-23 DIAGNOSIS — R32 Unspecified urinary incontinence: Secondary | ICD-10-CM | POA: Diagnosis not present

## 2019-01-23 DIAGNOSIS — R69 Illness, unspecified: Secondary | ICD-10-CM | POA: Diagnosis not present

## 2019-02-20 ENCOUNTER — Other Ambulatory Visit: Payer: Self-pay | Admitting: Family Medicine

## 2019-02-20 DIAGNOSIS — M810 Age-related osteoporosis without current pathological fracture: Secondary | ICD-10-CM

## 2019-03-07 ENCOUNTER — Other Ambulatory Visit: Payer: Self-pay | Admitting: Family Medicine

## 2019-04-01 ENCOUNTER — Ambulatory Visit (HOSPITAL_COMMUNITY)
Admission: EM | Admit: 2019-04-01 | Discharge: 2019-04-01 | Disposition: A | Discharge status: Home / Self Care | Payer: Medicare HMO | Attending: Family Medicine | Admitting: Family Medicine

## 2019-04-01 ENCOUNTER — Other Ambulatory Visit: Payer: Self-pay

## 2019-04-01 ENCOUNTER — Encounter (HOSPITAL_COMMUNITY): Payer: Self-pay

## 2019-04-01 DIAGNOSIS — Z20822 Contact with and (suspected) exposure to covid-19: Secondary | ICD-10-CM | POA: Diagnosis not present

## 2019-04-01 NOTE — Discharge Instructions (Addendum)
Your COVID 19 results will be available in 48-72 hours. Negative results are immediately resulted to Mychart. All positive results are communicated with a phone call from our office.  If you develop symptoms and your COVID test is negative, I recommend retesting.

## 2019-04-01 NOTE — ED Provider Notes (Signed)
[facility]    CSN: [id number] Arrival date & time: 04/01/19  1737      History   Chief Complaint Chief Complaint  Patient presents with  . COVID Exposure    HPI Summer Norris is a 69 y.o. female.   HPI  Summer Norris presents for COVID-19 testing following an exposure that occurred  Exposure: Close contact at work x 4 days  Onset of symptoms:  asymptomatic  Previously tested: none High risk for complications due to [id number]: include chronic smoker and prediabetes   Past Medical History:  Diagnosis Date  . Bell's palsy 03/10/2011   diagnosed in [place]. ongoing R sided facial weakness  . C7 radiculopathy 2016   right  . Carpal tunnel syndrome 2016   right  . Cataract    cataracts removed both eyes  . Osteoporosis   . Prediabetes   . Smoker     Patient Active Problem List   Diagnosis Date Noted  . Hx of adenomatous colonic polyps 12/14/2018  . Muscle tightness 04/28/2016  . Neck pain 04/28/2016  . Cervical radiculopathy 04/28/2016  . Osteoporosis 09/09/2015  . Cervical disc disorder with radiculopathy of cervical region 01/18/2015  . Carpal tunnel syndrome 01/18/2015  . Vitamin D deficiency 04/21/2012  . Bell's palsy 10/19/2011  . Tobacco use disorder 10/19/2011    Past Surgical History:  Procedure Laterality Date  . ABDOMINAL HYSTERECTOMY    . CATARACT EXTRACTION Bilateral 08/2014 and 09/2014   Dr. Herbert Deaner  . COLONOSCOPY  2009  . TOTAL ABDOMINAL HYSTERECTOMY W/ BILATERAL SALPINGOOPHORECTOMY  1995    OB History    Gravida  2   Para      Term      Preterm      AB  2   Living  0     SAB  2   TAB      Ectopic      Multiple      Live Births               Home Medications    Prior to Admission medications   Medication Sig Start Date End Date Taking? Authorizing Provider  alendronate (FOSAMAX) 70 MG tablet TAKE 1 TABLET EVERY 7 DAYS WITH A FULL GLASS OF WATER AND ON EMPTY STOMACH 02/20/19   Rita Ohara, MD    cholecalciferol (VITAMIN D) 1000 UNITS tablet Take 5,000 Units by mouth daily. Reported on 09/09/2015    [provider]  ergocalciferol (VITAMIN D2) 1.25 MG (50000 UT) capsule Take 1 capsule (50,000 Units total) by mouth once a week. 12/19/18   Rita Ohara, MD  ibuprofen (ADVIL) 800 MG tablet Take 1 tablet (800 mg total) by mouth 3 (three) times daily. Patient not taking: Reported on 12/15/2018 10/21/18   Raylene Everts, MD  Multiple Vitamin (MULTIVITAMIN) tablet Take 1 tablet by mouth daily.    [provider]  Omega-3 Fatty Acids (FISH OIL) 1000 MG CAPS Take 1 capsule by mouth daily.    [provider]    Family History Family History  Problem Relation Age of Onset  . Diabetes Mother   . Heart disease Father   . Arthritis Sister   . Diabetes Sister        prediabetes  . Sarcoidosis Brother   . Heart disease Brother 32  . Lymphoma Brother 84  . Cancer Brother 46       lymphoma  . Diabetes Sister  prediabetes  . Asthma Sister   . Drug abuse Brother   . Stroke Brother 25  . Kidney disease Brother   . Heart disease Brother        defibrillator  . Hypertension Brother   . Colon cancer Neg Hx   . Colon polyps Neg Hx   . Esophageal cancer Neg Hx   . Rectal cancer Neg Hx   . Stomach cancer Neg Hx     Social History Social History   Tobacco Use  . Smoking status: Current Every Day Smoker    Packs/day: 1.00    Years: 42.00    Pack years: 42.00    Types: E-cigarettes, Cigarettes  . Smokeless tobacco: Never Used  Substance Use Topics  . Alcohol use: Yes    Alcohol/week: 0.0 standard drinks    Comment: 0-1 drinks per month, maybe.  . Drug use: No     Allergies   Septra [sulfamethoxazole-trimethoprim]   Review of Systems Review of Systems Pertinent negatives listed in HPI Physical Exam Triage Vital Signs ED Triage Vitals  Enc Vitals Group     BP 04/01/19 1806 (!) 155/75     Pulse Rate 04/01/19 1806 84     Resp 04/01/19 1806 16      Temp 04/01/19 1806 98.9 F (37.2 C)     Temp Source 04/01/19 1806 Oral     SpO2 04/01/19 1806 95 %     Weight --      Height --      Head Circumference --      Peak Flow --      Pain Score 04/01/19 1805 0     Pain Loc --      Pain Edu? --      Excl. in [place]? --    No data found.  Updated Vital Signs BP (!) 155/75 (BP Location: Left Arm)   Pulse 84   Temp 98.9 F (37.2 C) (Oral)   Resp 16   SpO2 95%   Visual Acuity Right Eye Distance:   Left Eye Distance:   Bilateral Distance:    Right Eye Near:   Left Eye Near:    Bilateral Near:     Physical Exam General appearance: alert, well developed, well nourished, cooperative and in no distress Head: Normocephalic, without obvious abnormality, atraumatic Respiratory: Respirations even and unlabored, normal respiratory rate Heart: rate and rhythm normal. No gallop or murmurs noted on exam  Extremities: No gross deformities Skin: Skin color, texture, turgor normal. No rashes seen  Psych: Appropriate mood and affect. Neurologic:  Alert, oriented to person, place, and time, thought content appropriate. UC Treatments / Results  Labs (all labs ordered are listed, but only abnormal results are displayed) Labs Reviewed  NOVEL CORONAVIRUS, NAA (HOSP ORDER, SEND-OUT TO REF LAB; TAT 18-24 HRS)    EKG   Radiology No results found.  Procedures Procedures (including critical care time)  Medications Ordered in UC Medications - No data to display  Initial Impression / Assessment and Plan / UC Course  I have reviewed the triage vital signs and the nursing notes.  Pertinent labs & imaging results that were available during my care of the patient were reviewed by me and considered in my medical decision making (see chart for details).   Encounter for COVID-19 testing due to recent exposure. Exposure <5 days ago. Advised of risk of a false-negative result. If symptoms develop and today's test is negative, I recommend  re-testing. Your COVID-19 results will  be available in 48-72 hours. Negative results are immediately resulted to Mychart. All positive results are communicated with a phone call from our office. COVID-19 education provided.  Final Clinical Impressions(s) / UC Diagnoses   Final diagnoses:  Encounter for laboratory testing for COVID-19 virus  Exposure to COVID-19 virus     Discharge Instructions     Your COVID 19 results will be available in 48-72 hours. Negative results are immediately resulted to Mychart. All positive results are communicated with a phone call from our office.  If you develop symptoms and your COVID test is negative, I recommend retesting.    ED Prescriptions    None     PDMP not reviewed this encounter.   Scot Jun, FNP 04/01/19 1923

## 2019-04-01 NOTE — ED Triage Notes (Signed)
Patient presents to Urgent Care with complaints of needing covid testing since being exposed at work 4 days ago. Patient reports she has no sx.

## 2019-04-04 LAB — NOVEL CORONAVIRUS, NAA (HOSP ORDER, SEND-OUT TO REF LAB; TAT 18-24 HRS): SARS-CoV-2, NAA: NOT DETECTED

## 2019-07-03 ENCOUNTER — Other Ambulatory Visit: Payer: Self-pay

## 2019-07-03 ENCOUNTER — Telehealth (INDEPENDENT_AMBULATORY_CARE_PROVIDER_SITE_OTHER): Payer: Medicare HMO | Admitting: Family Medicine

## 2019-07-03 ENCOUNTER — Encounter: Payer: Self-pay | Admitting: Family Medicine

## 2019-07-03 VITALS — Ht 60.0 in | Wt 145.0 lb

## 2019-07-03 DIAGNOSIS — R040 Epistaxis: Secondary | ICD-10-CM

## 2019-07-03 NOTE — Progress Notes (Signed)
Start time: 3:27--started call.  Didn't actually connect with patient until 3:32 (she wasn't available, then couldn't connect to video, ultimately changed to phone call)  End time: 3:45  Virtual Visit via Telephone Note  I connected with Summer Norris on 07/03/19 by telephone after being unable to connect via video enabled telemedicine application. I verified that I am speaking with the correct person using two identifiers.  Location: Patient: work Provider: office   I discussed the limitations of evaluation and management by telemedicine and the availability of in person appointments. The patient expressed understanding and agreed to proceed.  History of Present Illness:  Chief Complaint  Patient presents with  . Epistaxis    VIRTUAL has been having nosebleeds 2-3 times per week for several weeks. No symptoms, not using any nasal sprays.    Patient is complaining of nosebleeds once or twice a week. The bleeding always occurs on the right side. She started using ibuprofen on Thursday after a tooth extraction. She had nosebleeds prior to taking any ibuprofen. Sometimes wakes up in the middle of the night with a nosebleed.  She denies any allergy symptoms or congestion, denies picking at the nose or any other nasal symptoms.  BP's at home have been normal, recalls it was fine at the dentist last week. Recalls usually seeing [id number] systolic. She recalls that BP this morning was 141/60. Denies excessive bleeding after tooth extraction last week.  Lab Results  Component Value Date   PLT 398 12/15/2018    PMH, PSH, SH reviewed  Outpatient Encounter Medications as of 07/03/2019  Medication Sig Note  . alendronate (FOSAMAX) 70 MG tablet TAKE 1 TABLET EVERY 7 DAYS WITH A FULL GLASS OF WATER AND ON EMPTY STOMACH   . cholecalciferol (VITAMIN D) 1000 UNITS tablet Take 5,000 Units by mouth daily. Reported on 09/09/2015 12/15/2018: Taking 4000 IU daily  . ibuprofen (ADVIL) 200 MG tablet  Take 400 mg by mouth every 6 (six) hours as needed. 07/03/2019: Last dose 12:30pm  . Multiple Vitamin (MULTIVITAMIN) tablet Take 1 tablet by mouth daily.   Marland Kitchen ibuprofen (ADVIL) 800 MG tablet Take 1 tablet (800 mg total) by mouth 3 (three) times daily. (Patient not taking: Reported on 07/03/2019)   . [DISCONTINUED] ergocalciferol (VITAMIN D2) 1.25 MG (50000 UT) capsule Take 1 capsule (50,000 Units total) by mouth once a week.   . [DISCONTINUED] Omega-3 Fatty Acids (FISH OIL) 1000 MG CAPS Take 1 capsule by mouth daily.    No facility-administered encounter medications on file as of 07/03/2019.   Allergies  Allergen Reactions  . Septra [Sulfamethoxazole-Trimethoprim]     Mouth itch   ROS: no fever, chills,headaches, URI symptoms, sinus pain, cough, GI complaints, other bleeding or bruising, rashes or any other complaints.   Observations/Objective:  Ht 5' (1.524 m)   Wt 145 lb (65.8 kg)   BMI 28.32 kg/m   141/60 at home today.  Alert, oriented female, in good spirits, in no distress Exam is limited due to telephone nature of the visit today.   Assessment and Plan:  Epistaxis - encouraged frequent use of nasal saline spray. Stop NSAIDS, use tylenol prn pain. f/u if BP elevated. May need ENT referral if nosebleeds persist   Follow Up Instructions:    I discussed the assessment and treatment plan with the patient. The patient was provided an opportunity to ask questions and all were answered. The patient agreed with the plan and demonstrated an understanding of the instructions.   The patient  was advised to call back or seek an in-person evaluation if the symptoms worsen or if the condition fails to improve as anticipated.  I provided 15 minutes of non-face-to-face telephone call time during this encounter, plus additional time spent documenting in chart and chart review.  Vikki Ports, MD

## 2019-07-03 NOTE — Patient Instructions (Addendum)
Use tylenol rather than ibuprofen for pain.  Get a nasal saline spray, and use this frequently (every hour or so), into the nostril that has been bleeding (fine to use in both).  Let us know if your nosebleeds persist, and we can refer your to an ENT for further evaluation.   Nosebleed, Adult A nosebleed is when blood comes out of the nose. Nosebleeds are common. Usually, they are not a sign of a serious condition. Nosebleeds can happen if a small blood vessel in your nose starts to bleed or if the lining of your nose (mucous membrane) cracks. They are commonly caused by:  Allergies.  Colds.  Picking your nose.  Blowing your nose too hard.  An injury from sticking an object into your nose or getting hit in the nose.  Dry or cold air. Less common causes of nosebleeds include:  Toxic fumes.  Something abnormal in the nose or in the air-filled spaces in the bones of the face (sinuses).  Growths in the nose, such as polyps.  Medicines or conditions that cause blood to clot slowly.  Certain illnesses or procedures that irritate or dry out the nasal passages. Follow these instructions at home: When you have a nosebleed:   Sit down and tilt your head slightly forward.  Use a clean towel or tissue to pinch your nostrils under the bony part of your nose. After 10 minutes, let go of your nose and see if bleeding starts again. Do not release pressure before that time. If there is still bleeding, repeat the pinching and holding for 10 minutes until the bleeding stops.  Do not place tissues or gauze in the nose to stop bleeding.  Avoid lying down and avoid tilting your head backward. That may make blood collect in the throat and cause gagging or coughing.  Use a nasal spray decongestant to help with a nosebleed as told by your health care provider.  Do not use petroleum jelly or mineral oil in your nose. It can drip into your lungs. After a nosebleed:  Avoid blowing your nose or  sniffing for a number of hours.  Avoid straining, lifting, or bending at the waist for several days. You may resume other normal activities as you are able.  Use saline spray or a humidifier as told by your health care provider.  Aspirinand blood thinners make bleeding more likely. If you are prescribed these medicines and you suffer from nosebleeds: ? Ask your health care provider if you should stop taking the medicines or if you should adjust the dose. ? Do not stop taking medicines that your health care provider has recommended unless told by your health care provider.  If your nosebleed was caused by dry mucous membranes, use over-the-counter saline nasal spray or gel. This will keep the mucous membranes moist and allow them to heal. If you must use a lubricant: ? Choose one that is water-soluble. ? Use only as much as you need and use it only as often as needed. ? Do not lie down until several hours after you use it. Contact a health care provider if:  You have a fever.  You get nosebleeds often or more often than usual.  You bruise very easily.  You have a nosebleed from having something stuck in your nose.  You have bleeding in your mouth.  You vomit or cough up brown material.  You have a nosebleed after you start a new medicine. Get help right away if:  You  have a nosebleed after a fall or a head injury.  Your nosebleed does not go away after 20 minutes.  You feel dizzy or weak.  You have unusual bleeding from other parts of your body.  You have unusual bruising on other parts of your body.  You become sweaty.  You vomit blood. This information is not intended to replace advice given to you by your health care provider. Make sure you discuss any questions you have with your health care provider. Document Revised: 06/01/2017 Document Reviewed: 09/17/2015 Elsevier Patient Education  [employer].

## 2019-07-31 DIAGNOSIS — R69 Illness, unspecified: Secondary | ICD-10-CM | POA: Diagnosis not present

## 2019-09-15 IMAGING — CT CT RENAL STONE PROTOCOL
2 of 4 series · 16 of 46 positions shown, 18 images · non-contrast
Comparison: None.

CLINICAL DATA: Flank pain right-sided

EXAM:
CT ABDOMEN AND PELVIS WITHOUT CONTRAST
TECHNIQUE: Multidetector CT imaging of the abdomen and pelvis was performed
following the standard protocol without IV contrast.

[Series 3: stone study 5.0 i30f 2 · axial · 0.74mm/px · z∈[+717,+1112]mm · 13 of 87 slices shown, 15 images]
[im 4/87  soft-tissue]
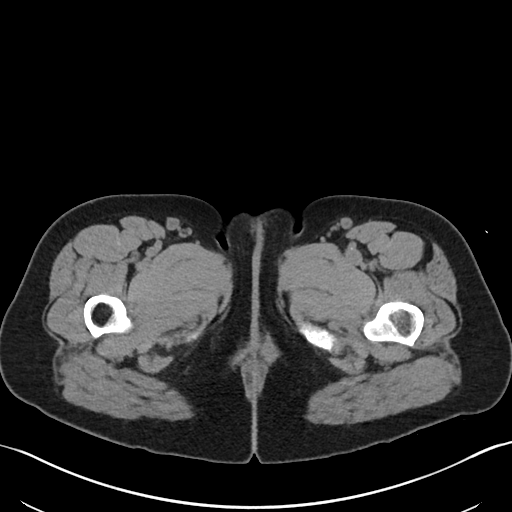
[im 4/87  bone]
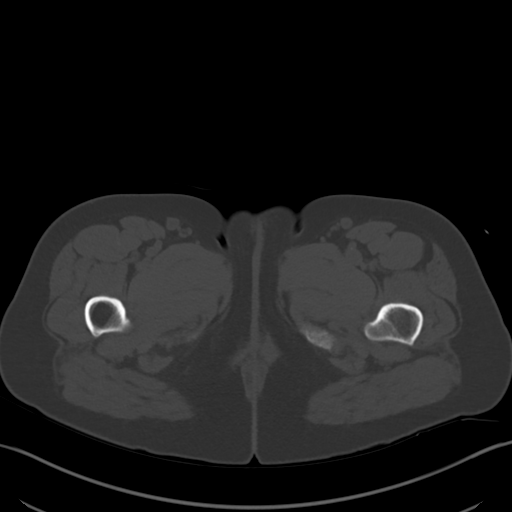
[im 11/87  soft-tissue]
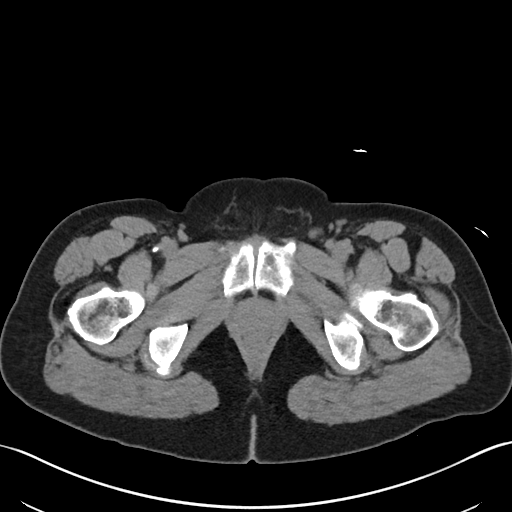
[im 18/87  soft-tissue]
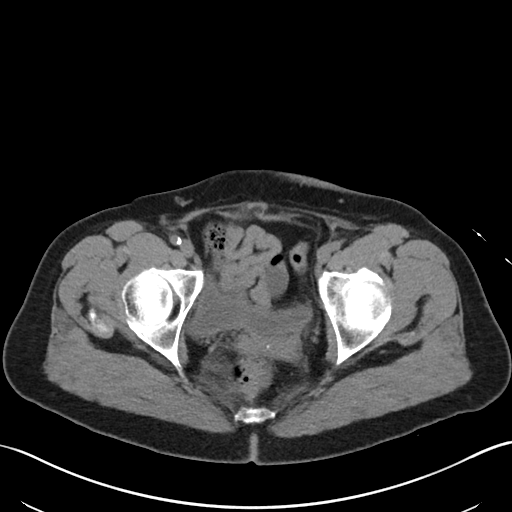
[im 25/87  soft-tissue]
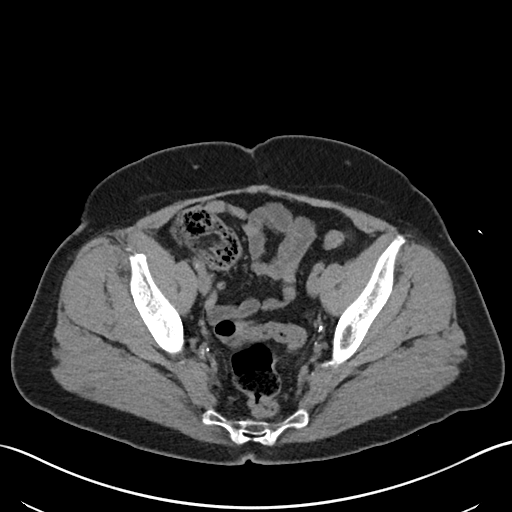
[im 31/87  soft-tissue]
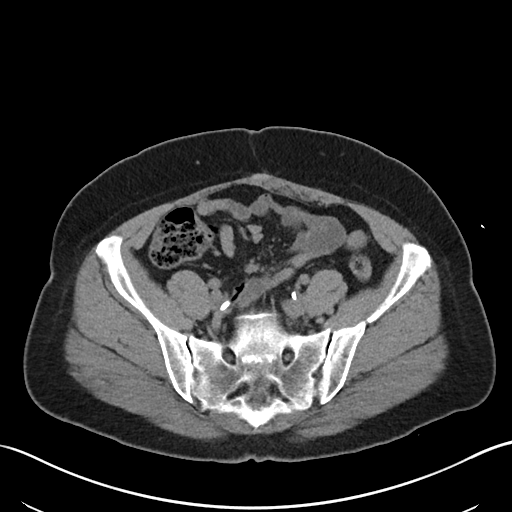
[im 38/87  soft-tissue]
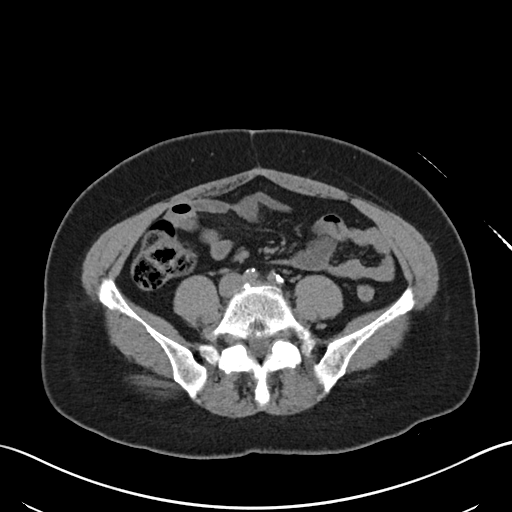
[im 45/87  soft-tissue]
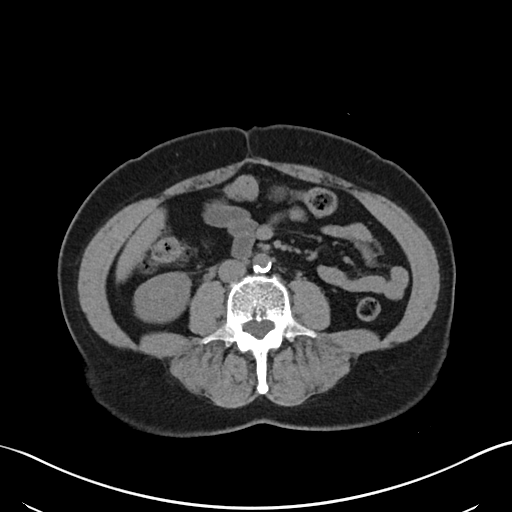
[im 49/87  soft-tissue]
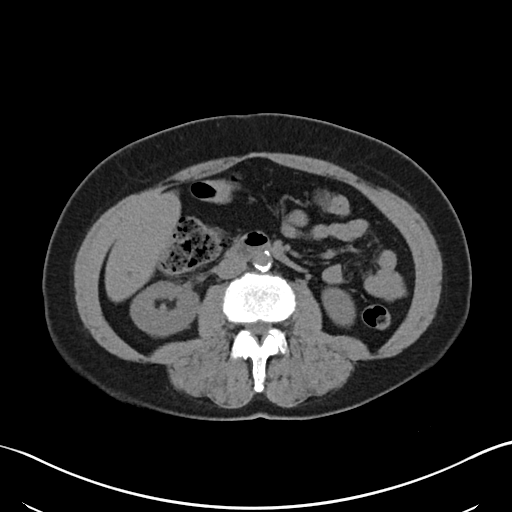
[im 56/87  soft-tissue]
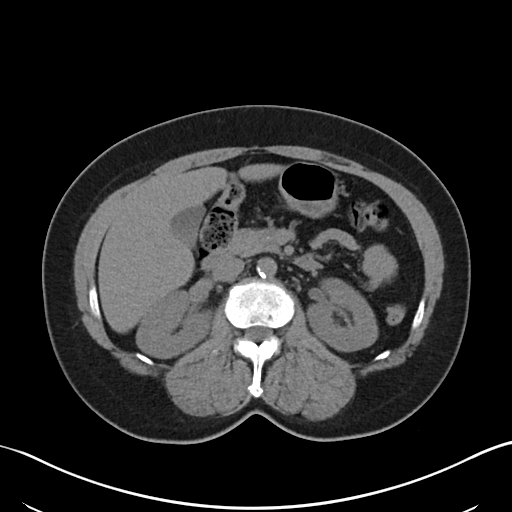
[im 56/87  bone]
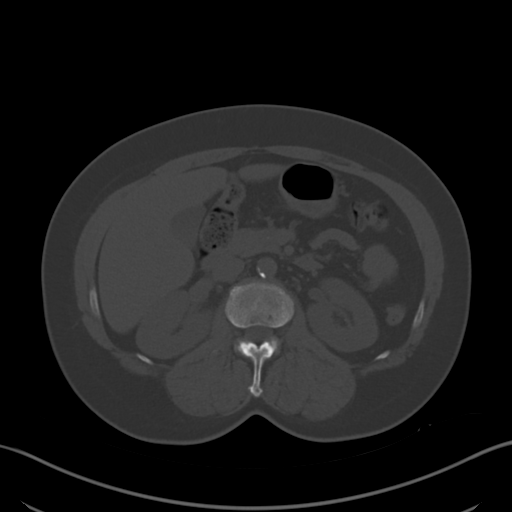
[im 62/87  soft-tissue]
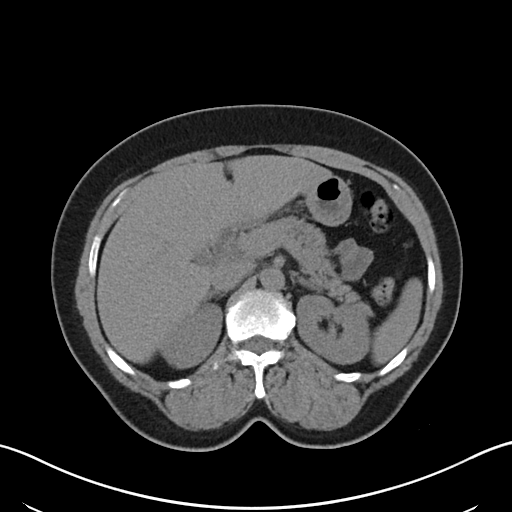
[im 69/87  soft-tissue]
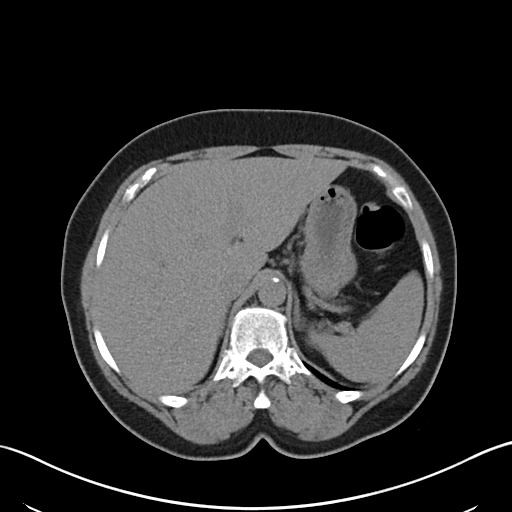
[im 76/87  soft-tissue]
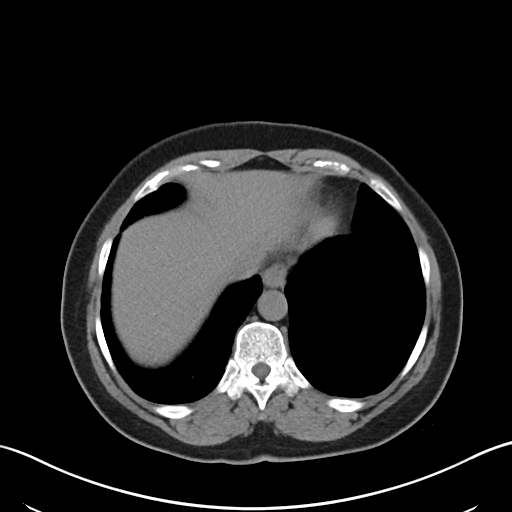
[im 83/87  soft-tissue]
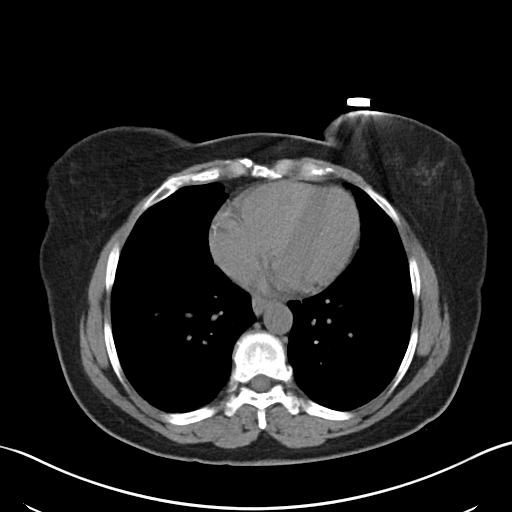

[Series 6: coronal soft tissue · coronal · 0.75mm/px · 3 of 101 slices shown]
[im 34/101  soft-tissue]
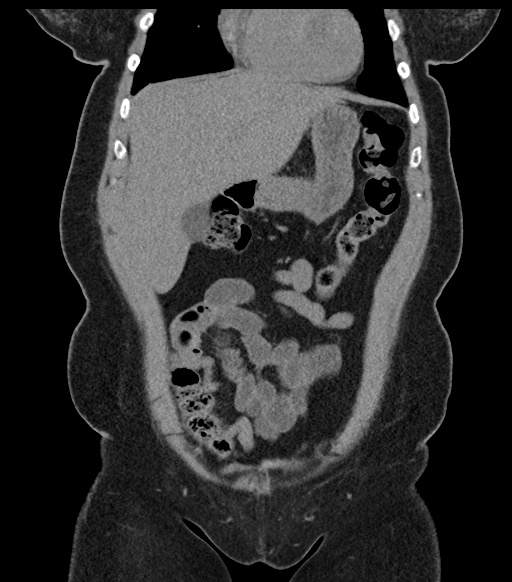
[im 45/101  soft-tissue]
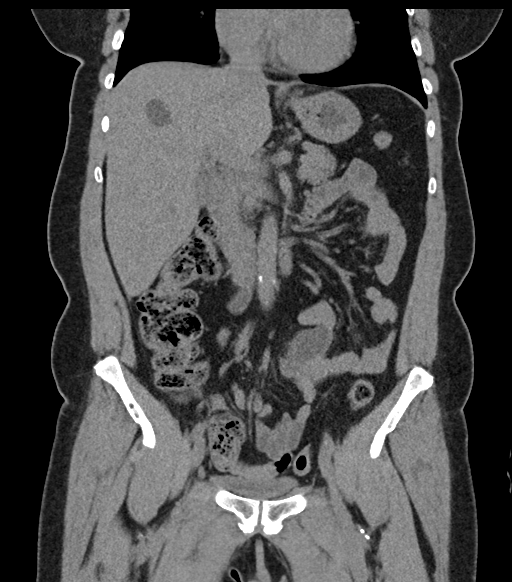
[im 56/101  soft-tissue]
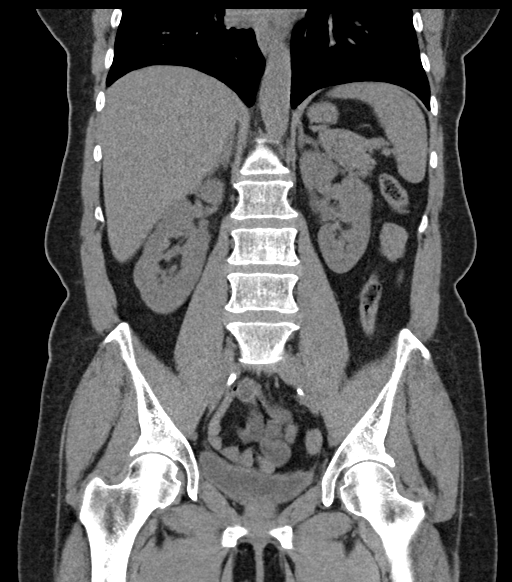

[16 of 46 positions shown; findings below may reference images not displayed]

FINDINGS: Lower chest: Scattered subpleural small nodules in the right lower
lobe and right middle lobe. 9 mm left lower lobe pulmonary nodule,
series 5, image number 9. No pleural effusion. Normal heart size.

Hepatobiliary: No calcified gallstones or biliary dilatation.
Subcentimeter hypodense liver lesions too small to further
characterize. 12 mm cystic lesion anterior liver with possible
internal density. 2.7 cm cystic lesion within the right lobe,
appears to contain septations and calcification.

Pancreas: Unremarkable. No pancreatic ductal dilatation or
surrounding inflammatory changes.

Spleen: Normal in size without focal abnormality.

Adrenals/Urinary Tract: Adrenal glands are within normal limits. No
hydronephrosis. Mild soft tissue stranding near the right renal
pelvis and around the proximal right ureter. No definitive stones
are seen along the course of the ureter. The bladder is negative.
Small stone or parenchymal calcification lower pole left kidney.

Stomach/Bowel: Stomach is within normal limits. Appendix appears
normal. No evidence of bowel wall thickening, distention, or
inflammatory changes.

Vascular/Lymphatic: Moderate aortic atherosclerosis. No aneurysmal
dilatation. No significantly enlarged lymph nodes

Reproductive: Status post hysterectomy. No adnexal masses.

Other: Negative for free air or free fluid.

Musculoskeletal: No acute or significant osseous findings.
IMPRESSION: 1. No hydronephrosis or ureteral stone. Minimal soft tissue
stranding near the right renal pelvis and around the proximal right
ureter, could relate to mild ascending infection versus recently
passed stone.
2. Slightly complex appearing cystic lesions in the liver. When the
patient is clinically stable and able to follow directions and hold
their breath (preferably as an outpatient) further evaluation with
dedicated abdominal MRI should be considered.
3. 9 mm left lower lobe pulmonary nodule. Consider one of the
following in 3 months for both low-risk and high-risk individuals:
(a) repeat chest CT, (b) follow-up PET-CT, or (c) tissue sampling.
This recommendation follows the consensus statement: Guidelines for
Management of Incidental Pulmonary Nodules Detected on CT Images:

## 2019-09-25 ENCOUNTER — Other Ambulatory Visit: Payer: Self-pay | Admitting: Family Medicine

## 2019-09-25 ENCOUNTER — Other Ambulatory Visit: Payer: Self-pay

## 2019-09-25 DIAGNOSIS — Z1231 Encounter for screening mammogram for malignant neoplasm of breast: Secondary | ICD-10-CM

## 2019-10-24 ENCOUNTER — Other Ambulatory Visit: Payer: Self-pay | Admitting: Family Medicine

## 2019-10-24 DIAGNOSIS — M810 Age-related osteoporosis without current pathological fracture: Secondary | ICD-10-CM

## 2019-10-24 NOTE — Telephone Encounter (Signed)
LVM to find out if pt needs this cript before her appt. [place]

## 2019-10-27 ENCOUNTER — Ambulatory Visit
Admission: RE | Admit: 2019-10-27 | Discharge: 2019-10-27 | Disposition: A | Discharge status: Home / Self Care | Payer: Medicare HMO | Source: Ambulatory Visit | Attending: Family Medicine | Admitting: Family Medicine

## 2019-10-27 ENCOUNTER — Other Ambulatory Visit: Payer: Self-pay

## 2019-10-27 DIAGNOSIS — Z1231 Encounter for screening mammogram for malignant neoplasm of breast: Secondary | ICD-10-CM | POA: Diagnosis not present

## 2019-11-03 DIAGNOSIS — H524 Presbyopia: Secondary | ICD-10-CM | POA: Diagnosis not present

## 2019-11-03 DIAGNOSIS — H35033 Hypertensive retinopathy, bilateral: Secondary | ICD-10-CM | POA: Diagnosis not present

## 2019-11-03 DIAGNOSIS — H35423 Microcystoid degeneration of retina, bilateral: Secondary | ICD-10-CM | POA: Diagnosis not present

## 2019-11-03 DIAGNOSIS — H26493 Other secondary cataract, bilateral: Secondary | ICD-10-CM | POA: Diagnosis not present

## 2019-11-03 DIAGNOSIS — Z961 Presence of intraocular lens: Secondary | ICD-10-CM | POA: Diagnosis not present

## 2019-12-19 NOTE — Progress Notes (Signed)
Chief Complaint  Patient presents with  . Medicare Wellness    fasting (blood in lab) AWV/CPE. Patient said her sister reports that she snores.   . Immunizations    she will take HD Flu shot today. Did not get shingrix at Pharmacy. She is in [employer] study, called them and if she gets booster here today she will be released from study. She has to get at [facility], she will do so.     Summer Norris is a 69 y.o. female who presents for annual physical exam, Medicare wellness visit and follow-up on chronic medical conditions.    She is concerned about her snoring.  Her sister told her about it when she stayed with her.  She isn't aware of any apnea.  She wakes up refreshed, and denies daytime somnolence. Denies morning headaches.  Seen in 06/2019 (telehealth) for epistaxis. She was encouraged to use of nasal saline spray frequently, stop NSAIDS, use tylenol prn pain; to f/u if BP elevated, and that she may need ENT referral if nosebleeds persisted. Bleeding stopped as soon as she stopped using advil, no recurrences.  Pre-diabetes--She tries to limit sweets, carbs, sugar.She drinks diet sodas. She puts sugar in her coffee. No other sugary beverages. Lab Results  Component Value Date   HGBA1C 5.8 (A) 12/15/2018   Smoker: She continues to smoke 1/2 PPD. She previously quit for a year with Chantix.  Re-started Chantix again, felt "like a zombie", and stopped it after a week and a half. She blames her job stress for not being ready to quit smoking.  Last year it was related to her brother. She went through the court system to get him out of her house (had been doing drugs in the house), he left 12/2018. He is drinking, has health problems (brain deteriorating after a stroke), no longer on drugs.  Living in family home in [place].  She has had h/o elevated BP's in the past. Shecontinues to limit sodium in her diet. BP at home are 130's-140 (didn't bring list).  Osteoporosis: She was started on  alendronate in 08/2015, after DEXA showedosteoporosis with T-3.0 at R fem neck.She had f/u DEXA in 8/2019which showed T-2.5 at R fem neck, statistically significant increase in BMD. She is tolerating this without side effects.Denies hip or jaw pain, denies dysphagia. She is due for repeat DEXA.  Vitamin D deficiency: She has been treated with prescription courses of D multiple times. Last level was 20.3 in 12/2018, when taking 4000 IU daily. She was treated with another 12 weeks of prescription weekly D, and was advised to start 5000 IU dose upon completion. She is currently taking MVI daily plus 5000 IU daily.  Immunization History  Administered Date(s) Administered  . Fluad Quad(high Dose 65+) 12/15/2018, 12/20/2019  . Influenza, High Dose Seasonal PF 12/24/2017  . PFIZER SARS-COV-2 Vaccination 11/08/2018, 11/29/2018  . Pneumococcal Conjugate-13 12/24/2017  . Pneumococcal Polysaccharide-23 12/15/2018  . Tdap 10/19/2011   Last Pap smear: N/A, s/p hysterectomy (for scar tissue, benign) Last mammogram:10/2019 Last colonoscopy: 01/2018 with Dr. Silverio Decamp.  There were many polyps, which were adenomatous, and 3 year follow-up recommended.  Also noted to have diverticulosis, non-bleeding internal hemorrhoids, and a non-bleeding colonic angioectasia. Last DEXA:10/2017 T-2.5 at R fem neck (improved from T-3.0 in 08/2015) Dentist:regularly, twice yearly Ophtho:yearly Exercise:Nothing outside of work.  Standing all day, some walking. Lifts some heavy items at work (20-40#).   Lipids: Lab Results  Component Value Date   CHOL 178 12/15/2018  HDL 34 (L) 12/15/2018   LDLCALC 118 (H) 12/15/2018   TRIG 142 12/15/2018   CHOLHDL 5.2 (H) 12/15/2018    Other doctors caring for patient include:  Dentist:Dr. Claiborne Billings Ophtho: Dr. Herbert Deaner GI:Dr. Silverio Decamp Derm: Dr. Moses Manners past) Neuro: Dr. Krista Blue (2013 for Bell's Palsy), Dr. Posey Pronto (did EMG/NCV in 2016) GYN: Dr. Leim Fabry longer  sees)   Depression screen:Negative Fall Screen: 1 fall (slipped on wet patio a couple of months ago, at her house).  No significant injury, just some pain in the left shoulder when sleeping. Function Status Survey: notableonly for leakage of urine (with cough/sneeze) Mini-Cogscreen: normal See full screens in Epic   End of Life Discussion: Patient does not havea living will and medical power of attorney. Forms given in the past, still has at home.  PMH, PSH, SH and FH were reviewed and updated.  Outpatient Encounter Medications as of 12/20/2019  Medication Sig Note  . alendronate (FOSAMAX) 70 MG tablet TAKE 1 TABLET EVERY 7 DAYS WITH A FULL GLASS OF WATER AND ON EMPTY STOMACH   . cholecalciferol (VITAMIN D) 1000 UNITS tablet Take 5,000 Units by mouth daily. Reported on 09/09/2015 12/20/2019: 5000 IU daily  . Multiple Vitamin (MULTIVITAMIN) tablet Take 1 tablet by mouth daily.   Marland Kitchen ibuprofen (ADVIL) 200 MG tablet Take 400 mg by mouth every 6 (six) hours as needed. (Patient not taking: Reported on 12/20/2019) 07/03/2019: Last dose 12:30pm  . ibuprofen (ADVIL) 800 MG tablet Take 1 tablet (800 mg total) by mouth 3 (three) times daily. (Patient not taking: Reported on 07/03/2019)    No facility-administered encounter medications on file as of 12/20/2019.   Allergies  Allergen Reactions  . Septra [Sulfamethoxazole-Trimethoprim]     Mouth itch    ROS: The patient denies anorexia, fever, headaches, decreased hearing, ear pain, sore throat, breast concerns, chest pain, palpitations, dizziness, syncope, dyspnea on exertion, swelling, nausea, vomiting, heartburn/indigestion, diarrhea, constipation, abdominal pain, melena, hematochezia, hematuria, dysuria, vaginal bleeding, discharge, odor or itch, genital lesions, numbness, tingling, weakness, tremor, suspicious skin lesions, depression, anxiety, abnormal bleeding/bruising, or enlarged lymph nodes. Some L shoulder pain since a fall--only  bothers her at night, when laying on it. Slight urinary leakage with cough    PHYSICAL EXAM:  BP 120/74   Pulse 84   Ht 5' (1.524 m)   Wt 161 lb 3.2 oz (73.1 kg)   BMI 31.48 kg/m   Wt Readings from Last 3 Encounters:  12/20/19 161 lb 3.2 oz (73.1 kg)  07/03/19 145 lb (65.8 kg)  12/15/18 156 lb 6.4 oz (70.9 kg)    General Appearance:   Alert, cooperative, no distress, appears stated age  Head:   Normocephalic, without obvious abnormality, atraumatic. She is wearing mask, so assessment for change of right sided facial weakness (as previously noted) couldn't be made.  Eyes:   PERRL, conjunctiva/corneas clear, EOM's intact; fundi benign  Ears:   TM's and EAC's normal  Nose:  Not examined, wearing mask due to COVID-19 pandemic  Throat:  Not examined, wearing mask due to COVID-19 pandemic  Neck:  Supple, no lymphadenopathy; thyroid: noenlargement/ tenderness/nodules; no carotid bruit appreciated  Back:  Spine nontender, no curvature, ROM normal, no CVA tenderness  Lungs:   Clear to auscultation bilaterally without wheezes, rales or ronchi; respirations unlabored.  Chest Wall:   No tenderness or deformity  Heart:   Regular rate and rhythm, S1 and S2 normal, rub or gallop. 2/6 SEM noted at the RUSB today.  Breast Exam:  No tenderness, masses, or nipple discharge or inversion. No axillary lymphadenopathy  Abdomen:   Soft, non-tender, nondistended, normoactive bowel sounds,   no masses, no hepatosplenomegaly. WHSS  Genitalia:   Normal external genitalia without lesions. BUS and vagina normal; uterus surgically absent. No abnormal vaginal discharge. No adnexal masses. Pap not performed  Rectal:   Normal tone (somewhat lax at first, but can tighten muscle well, unchanged from prior exam), no masses or tenderness, heme negative stool.  Extremities:  No clubbing, cyanosis or edema  Pulses:  2+ and symmetric all extremities  Skin:   Skin color, texture, turgor normal, no rashes or lesions  Lymph nodes:  Cervical, supraclavicular, and axillary nodes normal  Neurologic:  Normal strength, sensation and gait; reflexes 2+ and symmetric throughout   Psych: Normal mood, affect, hygiene and grooming  Lab Results  Component Value Date   HGBA1C 5.8 (A) 12/20/2019    ASSESSMENT/PLAN:  Annual physical exam - Plan: Comprehensive metabolic panel, CBC with Differential/Platelet, VITAMIN D 25 Hydroxy (Vit-D Deficiency, Fractures), Lipid panel  Medicare annual wellness visit, subsequent  Prediabetes - stable; encouraged weight loss, regular exercise - Plan: HgB A1c, Comprehensive metabolic panel  Dyslipidemia - low HDL in past. Encouraged quitting smoking, increasing exercise - Plan: Lipid panel  Vitamin D deficiency - Plan: VITAMIN D 25 Hydroxy (Vit-D Deficiency, Fractures), Vitamin D, Ergocalciferol, (DRISDOL) 1.25 MG (50000 UNIT) CAPS capsule  Hx of adenomatous colonic polyps - repeat colonoscopy due next year  Osteoporosis without current pathological fracture, unspecified osteoporosis type - continue alendronate. DEXA due. Cont Ca, D, weight-bearing exercise - Plan: DG Bone Density  Tobacco use - counseled in detail. Encouraged cessation  Need for influenza vaccination - Plan: Flu Vaccine QUAD High Dose(Fluad)   Discussed monthly self breast exams and yearly mammograms; at least 30 minutes of aerobic activity at least 5 days/week, weight-bearing exercise at least 2x/wk; proper sunscreen use reviewed; healthy diet, including goals of calcium and vitamin D intake and alcohol recommendations (less than or equal to 1 drink/day) reviewed; regular seatbelt use; changing batteries in smoke detectors. Immunization recommendations discussed--hgih dose flu shot given today.Shingrix is also recommended--discussed risks/SE, and she should get from pharmacy. Colon cancer screening recommendations--UTD,  repeat 01/2021 (3 year f/u). DEXA due--advised to contact [facility] to schedule it.  MOST form reviewed and updated. Full Code, Full Care. Reminded of the importance of living will and healthcare POA, and encouraged to fill out, notarize and get [place] a copy at her convenience.   Medicare Attestation I have personally reviewed: The patient's medical and social history Their use of alcohol, tobacco or illicit drugs Their current medications and supplements The patient's functional ability including ADLs,fall risks, home safety risks, cognitive, and hearing and visual impairment Diet and physical activities Evidence for depression or mood disorders  The patient's weight, height, BMI have been recorded in the chart.  I have made referrals, counseling, and provided education to the patient based on review of the above and I have provided the patient with a written personalized care plan for preventive services.

## 2019-12-19 NOTE — Patient Instructions (Addendum)
HEALTH MAINTENANCE RECOMMENDATIONS:  It is recommended that you get at least 30 minutes of aerobic exercise at least 5 days/week (for weight loss, you may need as much as 60-90 minutes). This can be any activity that gets your heart rate up. This can be divided in 10-15 minute intervals if needed, but try and build up your endurance at least once a week.  Weight bearing exercise is also recommended twice weekly.  Eat a healthy diet with lots of vegetables, fruits and fiber.  "Colorful" foods have a lot of vitamins (ie green vegetables, tomatoes, red peppers, etc).  Limit sweet tea, regular sodas and alcoholic beverages, all of which has a lot of calories and sugar.  Up to 1 alcoholic drink daily may be beneficial for women (unless trying to lose weight, watch sugars).  Drink a lot of water.  Calcium recommendations are 1200-1500 mg daily (1500 mg for postmenopausal women or women without ovaries), and vitamin D 1000 IU daily.  This should be obtained from diet and/or supplements (vitamins), and calcium should not be taken all at once, but in divided doses.  Monthly self breast exams and yearly mammograms for women over the age of 54 is recommended.  Sunscreen of at least SPF 30 should be used on all sun-exposed parts of the skin when outside between the hours of 10 am and 4 pm (not just when at beach or pool, but even with exercise, golf, tennis, and yard work!)  Use a sunscreen that says "broad spectrum" so it covers both UVA and UVB rays, and make sure to reapply every 1-2 hours.  Remember to change the batteries in your smoke detectors when changing your clock times in the spring and fall.  Use your seat belt every time you are in a car, and please drive safely and not be distracted with cell phones and texting while driving.   Summer Norris , Thank you for taking time to come for your Medicare Wellness Visit. I appreciate your ongoing commitment to your health goals. Please review the following  plan we discussed and let me know if I can assist you in the future.   This is a list of the screening recommended for you and due dates:  Health Maintenance  Topic Date Due  . COVID-19 Vaccine (1) Never done  . Flu Shot  10/15/2019  . Mammogram  10/26/2020  . Colon Cancer Screening  01/24/2021  . Tetanus Vaccine  10/18/2021  . DEXA scan (bone density measurement)  Completed  .  Hepatitis C: One time screening is recommended by Center for Disease Control  (CDC) for  adults born from [birth year] through 1963.   Completed  . Pneumonia vaccines  Completed   You were given flu shot today.  I recommend getting the new shingles vaccine (Shingrix). You will need to get this from the pharmacy rather than our office, as it is covered by Medicare Part D.  It is a series of 2 injections, spaced 2 months apart. You should wait 2-4 weeks after other vaccines before starting this.  Bone density test is due to be repeated (has been 2 years from the last one, to monitor your treatment). Please call the [facility] to schedule this.  Ask your sister if your took long pauses in your breathing, versus just loud snoring.  If there are pauses, then we should evaluate you with a sleep study.  If you just snored, but no pauses in your breathing, no further evaluation is needed.  Please try and quit smoking--start thinking about why/when you smoke (habit, boredom, stress) in order to come up with effective strategies to cut back or quit. Available resources to help you quit include free counseling through [facility] Quitline ([place]Quitline.com or 1-800-QUITNOW), smoking cessation classes through [facility] (call to find out schedule), over-the-counter nicotine replacements.  Many insurance companies also have smoking cessation programs (which may decrease the cost of patches, meds if enrolled).

## 2019-12-20 ENCOUNTER — Ambulatory Visit (INDEPENDENT_AMBULATORY_CARE_PROVIDER_SITE_OTHER): Payer: Medicare HMO | Admitting: Family Medicine

## 2019-12-20 ENCOUNTER — Encounter: Payer: Self-pay | Admitting: Family Medicine

## 2019-12-20 ENCOUNTER — Other Ambulatory Visit: Payer: Self-pay

## 2019-12-20 VITALS — BP 120/74 | HR 84 | Ht 60.0 in | Wt 161.2 lb

## 2019-12-20 DIAGNOSIS — Z720 Tobacco use: Secondary | ICD-10-CM | POA: Diagnosis not present

## 2019-12-20 DIAGNOSIS — Z23 Encounter for immunization: Secondary | ICD-10-CM

## 2019-12-20 DIAGNOSIS — Z860101 Personal history of adenomatous and serrated colon polyps: Secondary | ICD-10-CM

## 2019-12-20 DIAGNOSIS — E559 Vitamin D deficiency, unspecified: Secondary | ICD-10-CM | POA: Diagnosis not present

## 2019-12-20 DIAGNOSIS — R7303 Prediabetes: Secondary | ICD-10-CM

## 2019-12-20 DIAGNOSIS — M810 Age-related osteoporosis without current pathological fracture: Secondary | ICD-10-CM

## 2019-12-20 DIAGNOSIS — Z86010 Personal history of colonic polyps: Secondary | ICD-10-CM | POA: Diagnosis not present

## 2019-12-20 DIAGNOSIS — E785 Hyperlipidemia, unspecified: Secondary | ICD-10-CM

## 2019-12-20 DIAGNOSIS — Z0000 Encounter for general adult medical examination without abnormal findings: Secondary | ICD-10-CM | POA: Diagnosis not present

## 2019-12-20 LAB — POCT GLYCOSYLATED HEMOGLOBIN (HGB A1C): Hemoglobin A1C: 5.8 % — AB (ref 4.0–5.6)

## 2019-12-21 LAB — COMPREHENSIVE METABOLIC PANEL
ALT: 14 IU/L (ref 0–32)
AST: 15 IU/L (ref 0–40)
Albumin/Globulin Ratio: 1.6 (ref 1.2–2.2)
Albumin: 4.4 g/dL (ref 3.8–4.8)
Alkaline Phosphatase: 93 IU/L (ref 44–121)
BUN/Creatinine Ratio: 13 (ref 12–28)
BUN: 12 mg/dL (ref 8–27)
Bilirubin Total: 0.3 mg/dL (ref 0.0–1.2)
CO2: 23 mmol/L (ref 20–29)
Calcium: 9.7 mg/dL (ref 8.7–10.3)
Chloride: 106 mmol/L (ref 96–106)
Creatinine, Ser: 0.9 mg/dL (ref 0.57–1.00)
GFR calc Af Amer: 77 mL/min/{1.73_m2} (ref >59)
GFR calc non Af Amer: 66 mL/min/{1.73_m2} (ref >59)
Globulin, Total: 2.8 g/dL (ref 1.5–4.5)
Glucose: 103 mg/dL — ABNORMAL HIGH (ref 65–99)
Potassium: 4.6 mmol/L (ref 3.5–5.2)
Sodium: 142 mmol/L (ref 134–144)
Total Protein: 7.2 g/dL (ref 6.0–8.5)

## 2019-12-21 LAB — CBC WITH DIFFERENTIAL/PLATELET
Basophils Absolute: 0.1 10*3/uL (ref 0.0–0.2)
Basos: 1 %
EOS (ABSOLUTE): 0.2 10*3/uL (ref 0.0–0.4)
Eos: 2 %
Hematocrit: 48.4 % — ABNORMAL HIGH (ref 34.0–46.6)
Hemoglobin: 15.7 g/dL (ref 11.1–15.9)
Immature Grans (Abs): 0 10*3/uL (ref 0.0–0.1)
Immature Granulocytes: 0 %
Lymphocytes Absolute: 2.7 10*3/uL (ref 0.7–3.1)
Lymphs: 26 %
MCH: 28.7 pg (ref 26.6–33.0)
MCHC: 32.4 g/dL (ref 31.5–35.7)
MCV: 89 fL (ref 79–97)
Monocytes Absolute: 0.7 10*3/uL (ref 0.1–0.9)
Monocytes: 7 %
Neutrophils Absolute: 6.8 10*3/uL (ref 1.4–7.0)
Neutrophils: 64 %
Platelets: 377 10*3/uL (ref 150–450)
RBC: 5.47 x10E6/uL — ABNORMAL HIGH (ref 3.77–5.28)
RDW: 13.2 % (ref 11.7–15.4)
WBC: 10.6 10*3/uL (ref 3.4–10.8)

## 2019-12-21 LAB — LIPID PANEL
Chol/HDL Ratio: 5.9 ratio — ABNORMAL HIGH (ref 0.0–4.4)
Cholesterol, Total: 189 mg/dL (ref 100–199)
HDL: 32 mg/dL — ABNORMAL LOW (ref >39)
LDL Chol Calc (NIH): 120 mg/dL — ABNORMAL HIGH (ref 0–99)
Triglycerides: 210 mg/dL — ABNORMAL HIGH (ref 0–149)
VLDL Cholesterol Cal: 37 mg/dL (ref 5–40)

## 2019-12-21 LAB — VITAMIN D 25 HYDROXY (VIT D DEFICIENCY, FRACTURES): Vit D, 25-Hydroxy: 21.3 ng/mL — ABNORMAL LOW (ref 30.0–100.0)

## 2019-12-21 MED ORDER — VITAMIN D (ERGOCALCIFEROL) 1.25 MG (50000 UNIT) PO CAPS: 50000.0000 [IU] | ORAL_CAPSULE | ORAL | 0 refills | Status: DC

## 2019-12-21 NOTE — Progress Notes (Signed)
Wait on this for her to read it. If she hasn't read it by next week, please contact her to advise of results, and to set up a fasting 6 month check.  Offer to see if she prefers to get labs done prior.  She will need Vit D-OH, lipids, glucose (dx D deficiency, IFG, dyslipidemia) and orders placed if she prefers labs prior. Thanks (I sent in Rx for D)

## 2019-12-25 ENCOUNTER — Other Ambulatory Visit: Payer: Self-pay | Admitting: *Deleted

## 2019-12-25 DIAGNOSIS — E785 Hyperlipidemia, unspecified: Secondary | ICD-10-CM

## 2019-12-25 DIAGNOSIS — R7301 Impaired fasting glucose: Secondary | ICD-10-CM

## 2019-12-25 DIAGNOSIS — E559 Vitamin D deficiency, unspecified: Secondary | ICD-10-CM

## 2020-03-25 ENCOUNTER — Other Ambulatory Visit: Payer: Self-pay | Admitting: Family Medicine

## 2020-03-25 DIAGNOSIS — Z1231 Encounter for screening mammogram for malignant neoplasm of breast: Secondary | ICD-10-CM

## 2020-03-28 ENCOUNTER — Other Ambulatory Visit: Payer: Medicare HMO

## 2020-06-18 ENCOUNTER — Other Ambulatory Visit: Payer: Self-pay | Admitting: Family Medicine

## 2020-06-18 DIAGNOSIS — M810 Age-related osteoporosis without current pathological fracture: Secondary | ICD-10-CM

## 2020-06-18 NOTE — Telephone Encounter (Signed)
Left message for pt to call back . Has upcoming appt and wanted to know if she has enough to last until then

## 2020-06-24 ENCOUNTER — Other Ambulatory Visit: Payer: Medicare Other

## 2020-06-24 ENCOUNTER — Other Ambulatory Visit: Payer: Self-pay

## 2020-06-24 DIAGNOSIS — E785 Hyperlipidemia, unspecified: Secondary | ICD-10-CM

## 2020-06-24 DIAGNOSIS — R7301 Impaired fasting glucose: Secondary | ICD-10-CM

## 2020-06-24 DIAGNOSIS — E559 Vitamin D deficiency, unspecified: Secondary | ICD-10-CM

## 2020-06-25 LAB — LIPID PANEL
Chol/HDL Ratio: 5.3 ratio — ABNORMAL HIGH (ref 0.0–4.4)
Cholesterol, Total: 189 mg/dL (ref 100–199)
HDL: 36 mg/dL — ABNORMAL LOW (ref >39)
LDL Chol Calc (NIH): 127 mg/dL — ABNORMAL HIGH (ref 0–99)
Triglycerides: 146 mg/dL (ref 0–149)
VLDL Cholesterol Cal: 26 mg/dL (ref 5–40)

## 2020-06-25 LAB — GLUCOSE, RANDOM: Glucose: 94 mg/dL (ref 65–99)

## 2020-06-25 LAB — VITAMIN D 25 HYDROXY (VIT D DEFICIENCY, FRACTURES): Vit D, 25-Hydroxy: 17.6 ng/mL — ABNORMAL LOW (ref 30.0–100.0)

## 2020-06-28 ENCOUNTER — Other Ambulatory Visit: Payer: Self-pay

## 2020-07-01 ENCOUNTER — Encounter: Payer: Self-pay | Admitting: Family Medicine

## 2020-07-01 ENCOUNTER — Other Ambulatory Visit: Payer: Self-pay

## 2020-07-01 ENCOUNTER — Ambulatory Visit (INDEPENDENT_AMBULATORY_CARE_PROVIDER_SITE_OTHER): Payer: Medicare Other | Admitting: Family Medicine

## 2020-07-01 VITALS — BP 128/60 | HR 80 | Temp 97.7°F | Ht 60.0 in | Wt 157.0 lb

## 2020-07-01 DIAGNOSIS — N3001 Acute cystitis with hematuria: Secondary | ICD-10-CM

## 2020-07-01 DIAGNOSIS — R350 Frequency of micturition: Secondary | ICD-10-CM | POA: Diagnosis not present

## 2020-07-01 DIAGNOSIS — R102 Pelvic and perineal pain: Secondary | ICD-10-CM | POA: Diagnosis not present

## 2020-07-01 LAB — POCT URINALYSIS DIP (PROADVANTAGE DEVICE)
Bilirubin, UA: NEGATIVE
Glucose, UA: NEGATIVE mg/dL
Ketones, POC UA: NEGATIVE mg/dL
Leukocytes, UA: NEGATIVE
Nitrite, UA: POSITIVE — AB
Protein Ur, POC: 100 mg/dL — AB
Specific Gravity, Urine: 1.025
Urobilinogen, Ur: NEGATIVE
pH, UA: 5.5 (ref 5.0–8.0)

## 2020-07-01 MED ORDER — NITROFURANTOIN MONOHYD MACRO 100 MG PO CAPS: 100.0000 mg | ORAL_CAPSULE | Freq: Two times a day (BID) | ORAL | 0 refills | Status: AC

## 2020-07-01 NOTE — Progress Notes (Signed)
Subjective:  Summer Norris is a 70 y.o. female who complains of possible urinary tract infection.  She has had symptoms for 1 week.  Symptoms include urinary frequency, urgency and incompete bladder emptying. Patient denies fever, chills, back pain, N/V/D.  Last UTI was years ago.   Using Tylneol for current symptoms.    Patient does not have a history of recurrent UTI. Patient does not have a history of pyelonephritis.  No other aggravating or relieving factors.  No other c/o.  Past Medical History:  Diagnosis Date  . Bell's palsy 03/10/2011   diagnosed in [place]. ongoing R sided facial weakness  . C7 radiculopathy 2016   right  . Carpal tunnel syndrome 2016   right  . Cataract    cataracts removed both eyes  . Osteoporosis   . Prediabetes   . Smoker     ROS as in subjective  Reviewed allergies, medications, past medical, surgical, and social history.    Objective: Vitals:   07/01/20 1314  BP: 128/60  Pulse: 80  Temp: 97.7 F (36.5 C)    General appearance: alert, no distress, WD/WN, female Abdomen: +bs, soft, non tender, non distended, no masses, no hepatomegaly, no splenomegaly, no bruits Back: no CVA tenderness GU: declines      Laboratory:  Urine dipstick: nit, RBC.       Assessment: Acute cystitis with hematuria - Plan: Urine Culture, nitrofurantoin, macrocrystal-monohydrate, (MACROBID) 100 MG capsule  Urinary frequency - Plan: POCT Urinalysis DIP (Proadvantage Device), Urine Culture  Pelvic pressure in female - Plan: Urine Culture    Plan: Discussed symptoms, diagnosis, possible complications, and usual course of illness.   Macrobid prescribed, has done well in the past with this, allergy to sulfa. Advised increased water intake, can use OTC Tylenol for pain.      Urine culture sent.   Call or return if worse or not improving.

## 2020-07-04 LAB — URINE CULTURE

## 2020-07-06 NOTE — Progress Notes (Signed)
Start time: 11:33 End time: 11:56  Virtual Visit via Video Note  I connected with Summer Norris on 07/08/2020 by a video enabled telemedicine application and verified that I am speaking with the correct person using two identifiers.  Location: Patient: home Provider: office   I discussed the limitations of evaluation and management by telemedicine and the availability of in person appointments. The patient expressed understanding and agreed to proceed.  History of Present Illness:  Chief Complaint  Patient presents with  . Med check    VIRTUAL med check, labs already done. As far as UTI feels almost 100% better, but not completely. No new concerns or complaints.     Patient presents for 6 month follow-up.  See below for labs done prior to visit. She was [facility].  Culture showed E.coli, sensitive to the macrobid that she was prescribed.  Patient states that her urinary symptoms resolved.  Pre-diabetes--She tries to limit sweets, carbs, sugar.She drinks diet sodas. She puts sugar in her coffee. No other sugary beverages. Not getting regular exercise. Lab Results  Component Value Date   HGBA1C 5.8 (A) 12/20/2019   Smoker: She continues to smoke 1/2 PPD. She previously quit for a year with Chantix.  Re-started Chantix again, felt "like a zombie", and stopped it after a week and a half. At her last visit she blamed her job stress for not being ready to quit smoking.  (And prior to that, stress related to her brother had contributed--he is out of her home and living with family in [place]). Today she reports she is ready to quit, she has no excuses now.  She has had h/o elevated BP's in the past. Shecontinues to limit sodium in her diet.  BP's at home are running 120's/80's. She denies headaches, dizziness, chest pain, DOE, edema. BP Readings from Last 3 Encounters:  07/01/20 128/60  12/20/19 120/74  04/01/19 (!) 155/75   Osteoporosis: She was started on  alendronate in 08/2015, after DEXA showedosteoporosis with T-3.0 at R fem neck.She had f/u DEXA in 8/2019which showed T-2.5 at R fem neck, statistically significant increase in BMD. She is tolerating this without side effects.Denies hip or jaw pain, denies dysphagia. She is past due for repeat DEXA; it is scheduled for 10/2020.  Vitamin D deficiency:She has been treated with prescription courses of D multiple times. Level was 21.3 in 12/2019. She was treated with another 12 weeks of prescription weekly D, and was advised to restart 5000 IU dose upon completion. She is currently taking MVI daily plus 2000 IU daily.   PMH, PSH, SH reviewed  Outpatient Encounter Medications as of 07/08/2020  Medication Sig Note  . alendronate (FOSAMAX) 70 MG tablet TAKE 1 TABLET EVERY 7 DAYS WITH A FULL GLASS OF WATER AND ON EMPTY STOMACH   . cholecalciferol (VITAMIN D) 1000 UNITS tablet Take 5,000 Units by mouth daily. Reported on 09/09/2015 12/20/2019: 5000 IU daily  . Multiple Vitamin (MULTIVITAMIN) tablet Take 1 tablet by mouth daily.   . nitrofurantoin, macrocrystal-monohydrate, (MACROBID) 100 MG capsule Take 1 capsule (100 mg total) by mouth 2 (two) times daily. (Patient not taking: Reported on 07/08/2020)    No facility-administered encounter medications on file as of 07/08/2020.   Allergies  Allergen Reactions  . Septra [Sulfamethoxazole-Trimethoprim]     Mouth itch   ROS:  No fever, chills, headaches, dizziness, n/v/d, edema, chest pain, shortness of breath. Urinary symptoms resolved. No bleeding, bruising, rash, moods are good.  Observations/Objective:  BP 126/62   Pulse 82   Ht 5' (1.524 m)   Wt 152 lb (68.9 kg)   BMI 29.69 kg/m   Wt Readings from Last 3 Encounters:  07/08/20 152 lb (68.9 kg)  07/01/20 157 lb (71.2 kg)  12/20/19 161 lb 3.2 oz (73.1 kg)   Pleasant, well-appearing female, in good spirits. She is alert and oriented. She is speaking easily, in no distress Exam is limited  due to virtual nature of the visit.  Recent labs:  Vitamin D-OH 17.6 Fasting glucose 94  Component Ref Range & Units 12 d ago  (06/24/20) 6 mo ago  (12/20/19) 1 yr ago  (12/15/18) 2 yr ago  (05/27/18) 2 yr ago  (12/03/17) 5 yr ago  (07/11/14)  Cholesterol, Total 100 - 199 mg/dL 189  189  178  196  187    Triglycerides 0 - 149 mg/dL 146  210High  142  152High  128  133 R   HDL >39 mg/dL 36Low  32Low  34Low  35Low  29Low  35Low R, CM   VLDL Cholesterol Cal 5 - 40 mg/dL 26  37  '26  30  26    ' LDL Chol Calc (NIH) 0 - 99 mg/dL 127High  120High  118High      Chol/HDL Ratio 0.0 - 4.4 ratio 5.3High  5.9High CM  5.2High CM  5.6High CM  6.4High CM  4.8 R       Assessment and Plan:  Dyslipidemia - reviewed lowfat, low cholesterol diet; daily exercise and quitting smoking encouraged  Tobacco use - Risks reviewed, ready to quit. Encouraged to set quit date, stop buying cigarettes, and reach out for coaching  Vitamin D deficiency - treat with rx x 12 weeks, and upon completion to restart D3 at 5000 IU daily (can use up 2000 IU tabs by taking 2/d) - Plan: Vitamin D, Ergocalciferol, (DRISDOL) 1.25 MG (50000 UNIT) CAPS capsule  Osteoporosis without current pathological fracture, unspecified osteoporosis type - DEXA as planned. Cont Ca, fosamax. Encouraged weight-bearing exercise - Plan: alendronate (FOSAMAX) 70 MG tablet  Prediabetes - fasting glu normal.  Proper diet and regular exercise discussed.    Exercise, quit smoking to help with HDL (improved, but still low, chol/HDL ratio improved, but still high).  F/u in October 2022 as scheduled, with labs prior Cbc, c-met, lipid, Vit D, A1c  Follow Up Instructions:    I discussed the assessment and treatment plan with the patient. The patient was provided an opportunity to ask questions and all were answered. The patient agreed with the plan and demonstrated an understanding of the instructions.   The patient was  advised to call back or seek an in-person evaluation if the symptoms worsen or if the condition fails to improve as anticipated.  I spent 30 minutes dedicated to the care of this patient, including pre-visit review of records, face to face time, post-visit ordering of testing and documentation.   Vikki Ports, MD

## 2020-07-08 ENCOUNTER — Telehealth (INDEPENDENT_AMBULATORY_CARE_PROVIDER_SITE_OTHER): Payer: Medicare Other | Admitting: Family Medicine

## 2020-07-08 ENCOUNTER — Encounter: Payer: Self-pay | Admitting: Family Medicine

## 2020-07-08 ENCOUNTER — Other Ambulatory Visit: Payer: Self-pay

## 2020-07-08 VITALS — BP 126/62 | HR 82 | Ht 60.0 in | Wt 152.0 lb

## 2020-07-08 DIAGNOSIS — E785 Hyperlipidemia, unspecified: Secondary | ICD-10-CM | POA: Diagnosis not present

## 2020-07-08 DIAGNOSIS — Z5181 Encounter for therapeutic drug level monitoring: Secondary | ICD-10-CM

## 2020-07-08 DIAGNOSIS — M810 Age-related osteoporosis without current pathological fracture: Secondary | ICD-10-CM | POA: Diagnosis not present

## 2020-07-08 DIAGNOSIS — E559 Vitamin D deficiency, unspecified: Secondary | ICD-10-CM

## 2020-07-08 DIAGNOSIS — R7303 Prediabetes: Secondary | ICD-10-CM

## 2020-07-08 DIAGNOSIS — Z720 Tobacco use: Secondary | ICD-10-CM | POA: Diagnosis not present

## 2020-07-08 MED ORDER — VITAMIN D (ERGOCALCIFEROL) 1.25 MG (50000 UNIT) PO CAPS: 50000.0000 [IU] | ORAL_CAPSULE | ORAL | 0 refills | Status: AC

## 2020-07-08 MED ORDER — ALENDRONATE SODIUM 70 MG PO TABS: ORAL_TABLET | ORAL | 1 refills | Status: AC

## 2020-07-08 NOTE — Patient Instructions (Addendum)
We are sending in a prescription for weekly vitamin D for 12 weeks. You can stop the over-the-counter vitamin D while taking the prescription. Once you take the last prescription capsule, start taking Vitamin D3 (over-the-counter) 4000 IU daily (taking 2 of the 2000 IU) until you run out, and then buying a 5000 IU dose, and taking 1 daily, longterm.  Please try and exercise 30 minutes daily. Quitting smoking and exercise will help raise the HDL (and lower the chol/HDL ratio in your cholesterol panel).  1-800 QUIT NOW or [place]Quitline.com may be helpful in to be successful in quitting smoking.  We discussed setting a quit date, not buying cigarettes, think about what you can do instead of smoking.

## 2020-07-09 NOTE — Progress Notes (Signed)
Called and left message for pt

## 2020-07-22 ENCOUNTER — Other Ambulatory Visit: Payer: Self-pay | Admitting: Family Medicine

## 2020-07-22 DIAGNOSIS — M810 Age-related osteoporosis without current pathological fracture: Secondary | ICD-10-CM

## 2020-10-28 ENCOUNTER — Other Ambulatory Visit: Payer: Medicare HMO

## 2020-10-28 ENCOUNTER — Ambulatory Visit: Payer: Medicare HMO

## 2020-12-24 ENCOUNTER — Other Ambulatory Visit: Payer: Self-pay | Admitting: Family Medicine

## 2020-12-24 DIAGNOSIS — M810 Age-related osteoporosis without current pathological fracture: Secondary | ICD-10-CM

## 2020-12-24 NOTE — Telephone Encounter (Signed)
[facility] is requesting to fill pt fosamax. Looks as if you are not the PCP and future appointments have been canceled Please advise [facility]

## 2020-12-24 NOTE — Telephone Encounter (Signed)
Deny

## 2020-12-25 ENCOUNTER — Other Ambulatory Visit: Payer: Medicare HMO

## 2020-12-25 ENCOUNTER — Ambulatory Visit: Payer: Medicare HMO

## 2020-12-30 ENCOUNTER — Ambulatory Visit: Payer: Medicare HMO | Admitting: Family Medicine

## 2021-01-18 ENCOUNTER — Other Ambulatory Visit: Payer: Self-pay | Admitting: Family Medicine

## 2021-01-18 DIAGNOSIS — M810 Age-related osteoporosis without current pathological fracture: Secondary | ICD-10-CM

## 2021-05-13 ENCOUNTER — Encounter: Payer: Self-pay | Admitting: Gastroenterology

## 2022-08-05 ENCOUNTER — Telehealth: Payer: Self-pay | Admitting: Family Medicine

## 2022-08-05 NOTE — Telephone Encounter (Signed)
[facility] Medical records request forwarded to [facility]
